# Patient Record
Sex: Male | Born: 1945 | Race: White | Hispanic: No | Marital: Married | Smoking: Former smoker
Health system: Southern US, Community
[De-identification: ages and names within clinical notes are randomized; demographics above are authoritative.]

## PROBLEM LIST (undated history)

## (undated) DIAGNOSIS — E785 Hyperlipidemia, unspecified: Secondary | ICD-10-CM

## (undated) DIAGNOSIS — I10 Essential (primary) hypertension: Secondary | ICD-10-CM

## (undated) DIAGNOSIS — I472 Ventricular tachycardia: Secondary | ICD-10-CM

## (undated) DIAGNOSIS — I2510 Atherosclerotic heart disease of native coronary artery without angina pectoris: Secondary | ICD-10-CM

## (undated) DIAGNOSIS — G4730 Sleep apnea, unspecified: Secondary | ICD-10-CM

## (undated) DIAGNOSIS — Z9861 Coronary angioplasty status: Secondary | ICD-10-CM

## (undated) DIAGNOSIS — E6609 Other obesity due to excess calories: Secondary | ICD-10-CM

## (undated) DIAGNOSIS — I493 Ventricular premature depolarization: Secondary | ICD-10-CM

## (undated) DIAGNOSIS — Z951 Presence of aortocoronary bypass graft: Secondary | ICD-10-CM

## (undated) DIAGNOSIS — I499 Cardiac arrhythmia, unspecified: Secondary | ICD-10-CM

## (undated) DIAGNOSIS — I25119 Atherosclerotic heart disease of native coronary artery with unspecified angina pectoris: Secondary | ICD-10-CM

## (undated) DIAGNOSIS — IMO0001 Reserved for inherently not codable concepts without codable children: Secondary | ICD-10-CM

## (undated) DIAGNOSIS — I517 Cardiomegaly: Secondary | ICD-10-CM

## (undated) DIAGNOSIS — R011 Cardiac murmur, unspecified: Secondary | ICD-10-CM

## (undated) HISTORY — DX: Other obesity due to excess calories: E66.09

## (undated) HISTORY — DX: Atherosclerotic heart disease of native coronary artery without angina pectoris: I25.10

## (undated) HISTORY — DX: Cardiac murmur, unspecified: R01.1

## (undated) HISTORY — DX: Hyperlipidemia, unspecified: E78.5

## (undated) HISTORY — DX: Essential (primary) hypertension: I10

## (undated) HISTORY — PX: TOTAL HIP ARTHROPLASTY: SHX124

## (undated) HISTORY — PX: NM MYOVIEW LTD: HXRAD82

## (undated) HISTORY — PX: EYE SURGERY: SHX253

## (undated) HISTORY — DX: Reserved for inherently not codable concepts without codable children: IMO0001

## (undated) HISTORY — DX: Cardiomegaly: I51.7

## (undated) HISTORY — PX: TONSILLECTOMY: SUR1361

---
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
## 1989-07-22 HISTORY — PX: NECK SURGERY: SHX720

## 1997-07-22 HISTORY — PX: CORONARY ARTERY BYPASS GRAFT: SHX141

## 2006-10-13 ENCOUNTER — Ambulatory Visit (HOSPITAL_BASED_OUTPATIENT_CLINIC_OR_DEPARTMENT_OTHER): Admission: RE | Admit: 2006-10-13 | Discharge: 2006-10-13 | Payer: Self-pay | Admitting: Orthopedic Surgery

## 2007-02-03 ENCOUNTER — Inpatient Hospital Stay (HOSPITAL_COMMUNITY): Admission: RE | Admit: 2007-02-03 | Discharge: 2007-02-06 | Payer: Self-pay | Admitting: Cardiovascular Disease

## 2007-12-16 ENCOUNTER — Inpatient Hospital Stay (HOSPITAL_COMMUNITY): Admission: AD | Admit: 2007-12-16 | Discharge: 2007-12-18 | Payer: Self-pay | Admitting: Cardiovascular Disease

## 2008-10-24 ENCOUNTER — Inpatient Hospital Stay (HOSPITAL_COMMUNITY): Admission: AD | Admit: 2008-10-24 | Discharge: 2008-10-26 | Payer: Self-pay | Admitting: Cardiovascular Disease

## 2010-10-31 LAB — COMPREHENSIVE METABOLIC PANEL
ALT: 21 U/L (ref 0–53)
AST: 25 U/L (ref 0–37)
Alkaline Phosphatase: 44 U/L (ref 39–117)
CO2: 23 mEq/L (ref 19–32)
Calcium: 9.2 mg/dL (ref 8.4–10.5)
GFR calc Af Amer: >60 mL/min (ref >60)
GFR calc non Af Amer: >60 mL/min (ref >60)
Potassium: 4.1 mEq/L (ref 3.5–5.1)
Sodium: 136 mEq/L (ref 135–145)

## 2010-10-31 LAB — CARDIAC PANEL(CRET KIN+CKTOT+MB+TROPI)
CK, MB: 1.2 ng/mL (ref 0.3–4.0)
CK, MB: 1.7 ng/mL (ref 0.3–4.0)
Relative Index: INVALID (ref 0.0–2.5)
Relative Index: INVALID (ref 0.0–2.5)
Relative Index: INVALID (ref 0.0–2.5)
Total CK: 44 U/L (ref 7–232)
Total CK: 67 U/L (ref 7–232)
Troponin I: <0.01 ng/mL (ref 0.00–0.06)
Troponin I: <0.01 ng/mL (ref 0.00–0.06)

## 2010-10-31 LAB — CBC
HCT: 42.7 % (ref 39.0–52.0)
HCT: 43.7 % (ref 39.0–52.0)
MCHC: 34.9 g/dL (ref 30.0–36.0)
MCV: 94.7 fL (ref 78.0–100.0)
Platelets: 103 10*3/uL — ABNORMAL LOW (ref 150–400)
Platelets: 114 10*3/uL — ABNORMAL LOW (ref 150–400)
RBC: 4.8 MIL/uL (ref 4.22–5.81)
RDW: 12.9 % (ref 11.5–15.5)
WBC: 5.2 10*3/uL (ref 4.0–10.5)
WBC: 6.2 10*3/uL (ref 4.0–10.5)

## 2010-10-31 LAB — URINALYSIS, ROUTINE W REFLEX MICROSCOPIC
Bilirubin Urine: NEGATIVE
Glucose, UA: NEGATIVE mg/dL
Ketones, ur: NEGATIVE mg/dL
pH: 6.5 (ref 5.0–8.0)

## 2010-10-31 LAB — DIFFERENTIAL
Eosinophils Absolute: 0.1 10*3/uL (ref 0.0–0.7)
Eosinophils Relative: 1 % (ref 0–5)
Lymphs Abs: 1.5 10*3/uL (ref 0.7–4.0)
Monocytes Relative: 9 % (ref 3–12)

## 2010-10-31 LAB — HEPARIN LEVEL (UNFRACTIONATED): Heparin Unfractionated: 0.72 IU/mL — ABNORMAL HIGH (ref 0.30–0.70)

## 2010-10-31 LAB — PROTIME-INR: INR: 1 (ref 0.00–1.49)

## 2010-10-31 LAB — LIPID PANEL
HDL: 35 mg/dL — ABNORMAL LOW (ref >39)
Triglycerides: 46 mg/dL (ref <150)
VLDL: 9 mg/dL (ref 0–40)

## 2010-10-31 LAB — BASIC METABOLIC PANEL
BUN: 11 mg/dL (ref 6–23)
BUN: 7 mg/dL (ref 6–23)
Calcium: 8.8 mg/dL (ref 8.4–10.5)
Chloride: 108 mEq/L (ref 96–112)
Creatinine, Ser: 0.95 mg/dL (ref 0.4–1.5)
GFR calc non Af Amer: >60 mL/min (ref >60)
Glucose, Bld: 91 mg/dL (ref 70–99)
Potassium: 3.6 mEq/L (ref 3.5–5.1)
Potassium: 3.7 mEq/L (ref 3.5–5.1)

## 2010-10-31 LAB — APTT
aPTT: 186 seconds — ABNORMAL HIGH (ref 24–37)
aPTT: 27 seconds (ref 24–37)

## 2010-10-31 LAB — HEMOGLOBIN A1C: Hgb A1c MFr Bld: 4.8 % (ref 4.6–6.1)

## 2010-12-04 NOTE — Cardiovascular Report (Signed)
NAME:  Dean Wilkins, Dean Wilkins NO.:  [id number]   MEDICAL RECORD NO.:  [id number]          PATIENT TYPE:  INP   LOCATION:  2908                         FACILITY:  [facility]   PHYSICIAN:  Richard A. Alanda Amass, M.D.DATE OF BIRTH:  [birth year]   DATE OF PROCEDURE:  12/17/2007  DATE OF DISCHARGE:                            CARDIAC CATHETERIZATION   PROCEDURES:  Retrograde central aortic catheterization, selective  coronary angiography via Judkins technique, left ventricular angiogram,  right anterior oblique and left anterior oblique projection, saphenous  vein graft angiography, subselective left internal mammary artery,  aortic root angiogram, left anterior oblique projection, abdominal  aortic angiogram, and midstream pulmonary artery projection.   PROCEDURE IN DETAIL:  The patient was brought to the second floor of CP  lab in a postabsorptive state after premedication of 5 mg Valium p.o.  The right groin was prepped and draped in the usual manner.  A 1%  Xylocaine was used for local anesthesia and CRFA was entered with a  single arterial puncture using an 18-gauge thin wall needle.  A 6-French  sidearm sheath was inserted without difficulty.  Diagnostic coronary  angiography was done with 6-French 4-cm taper Cordis preformed coronary  catheters.  Subselective LIMA and left subclavian injection was done  with the right coronary catheter.  Previous angiography had demonstrated  occlusion of these 2 aortic anastomoses; one was an SVG to the OM and  one was an atretic RIMA to the distal RCA visualized retrograde.  Aortic  root angiogram was done at 25 mL, 20 mL per second in the LAO  projection.  An ejection about the level of the renal arteries with the  pigtail catheter was done in the abdominal aorta demonstrating 2 widely  patent single renal arteries on the left and one single renal artery on  the right.   The catheter was removed.  The sidearm sheath was flushed.   Pending  review of the cineangiogram.  The patient tolerated the diagnostic  procedure well.   PRESSURES:  LV:  115/0; LVEDP 18-20 mmHg.  CA:  115/70 mmHg.   There is no gradient across the aortic valve on catheter pullback.   Fluoroscopy showed 2+ calcification of the right and 2+ calcification of  the proximal LAD.  No significant intracardiac or valvular calcification  seen.   LV angiogram demonstrated hypokinesis in the mid anterolateral wall with  a moderate degree in the RAO projection, only minimal LAO projection,  and minimal inferior wall hypokinesis.  Overall ejection fraction was  approximately 50%-55%.  There was no mitral regurgitation, angiographic  LVH present.   Aortic root angiogram did not demonstrate any AI, and there were no  visualization of any antegrade grafts from the graft markers.   The main left coronary was small probably mildly diseased about 30%  smooth with no significant focal stenosis and good flow.  The LAD was  occluded in its proximal third with no antegrade flow.   The circumflex artery had 30% narrowing proximally.  Then it gave off a  large first marginal branch from the junction of proximal third that had  30%-40% narrowing with good residual lumen.  The main branch bifurcated  distally and had no significant stenosis.  A superior bifurcation branch  had a 95%-99% focal stenosis and 90% segmental stenosis with the band  and small bifurcating distal branch but still had antegrade flow and was  unchanged from prior angiograms.  The AV groove circ terminated in 2  bifurcating branches that were large with no significant stenosis, and  there was a moderate-sized CABG branch that was normal.   The right coronary was a dominant vessel.  There was 30% proximal  narrowing with good residual lumen, 40% narrowing slightly hypodense  beyond the RA and conus branch.  There were 2 tandem mid RCA stents in  place.  The first was a non-DES that had been  placed in 2000 and was an  AVE 3.5 x 16 that had no significant restenosis.  The second was  overlapping the DES stent placed by Dr. Allyson Sabal for progression of disease  on February 03, 2007, 3.0 x 23 Cypher postdilated to 3.5 and had no  significant stenosis.  Beyond this; however, there was eccentric 75% or  greater stenosis mildly focal beyond the stented area and associated 50%  irregularity and narrowing beyond that.  The remainder of the right  coronary had a very large PDA and PLA branch that bifurcated.  Had no  significant stenosis with the  AV node branch arising from the PLA.   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  He is a brother of one of our  employees.  Quit smoking about 40 years ago.  He had CABG in [facility]  in 1999 and prior angiography has showed patent LIMA to LAD with  occluded vein graft to circumflex and an atretic RIMA to the distal  right.  In 2000, he had non-DES stent to the mid right and in 2007/08 he  had a DES stent placed tandem to this as outlined above.  He has done  well up until the last several months when he has developed recurrent  angina over the last 1-2 weeks.  He has had typical substernal chest  pain at rest and with mild exertion and worse over 2 days prior to  admission where he had multiple episodes relieved by nitroglycerin.  He  was admitted to the hospital yesterday from the office.  He had negative  enzymes.  No acute EKG changes.  He has been pain-free on IV  nitroglycerin and heparin.  He has been on chronic aspirin and Plavix  therapy as well as beta blockers, Ranexa, ACE, fish oil, and nitrates.   Catheterization shows widely patent LIMA to LAD with good filling to the  apex which was unchanged and although that is a thin LAD, there was no  focal stenosis and good flow.  He has an atretic RIMA to the distal RCA  which was unchanged and a new lesion beyond his  previously placed mid  RCA stents probably accounting for his clinical ischemia which is very  typical and new for him.  He has an old lesion of the superior branch of  the large first marginal that was unable to be crossed in the past by  Dr. Allyson Sabal.  Does have some antegrade filling, but is unchanged and feeds  a small distal vessel.  So, I would recommend continued medical therapy  for this.  The patient appears to be  a candidate for intervention.  I  have discussed the case with Dr. Elsie Lincoln, and he will consider proceeding  with intervention and stenting of his mid RCA.   CATHETERIZATION DIAGNOSES:  1. Atherosclerotic heart disease, status post remote coronary artery      bypass graft x3 1999.  2. Three or four grafts occluded August 11, 2007, with patent left      internal mammary artery to left anterior descending artery.  3. Non-drug eluting stent AVE stent, mid right coronary artery 2000.  4. Tandem mid right coronary artery stent, drug-eluting stent Cypher,      July 2008.  5. Hyperlipidemia.  6. Hypertension.  7. Patent renal arteries.  8. 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  9. Unstable angina, accelerated related to new right coronary lesion.      Plan intervention as outlined above.  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  11.Gastroesophageal reflux disease.  12.Hyperlipidemia on therapy.      Richard A. Alanda Amass, M.D.  Electronically Signed     RAW/MEDQ  D:  12/17/2007  T:  12/18/2007  Job:  [id number]   cc:   [facility]  Madaline Savage, M.D.

## 2010-12-04 NOTE — Cardiovascular Report (Signed)
NAME:  Dean Wilkins, Dean Wilkins NO.:  [id number]   MEDICAL RECORD NO.:  [id number]          PATIENT TYPE:  INP   LOCATION:  2908                         FACILITY:  [facility]   PHYSICIAN:  Madaline Savage, M.D.DATE OF BIRTH:  [birth year]   DATE OF PROCEDURE:  DATE OF DISCHARGE:                            CARDIAC CATHETERIZATION   INDICATIONS FOR PROCEDURE:  [name] is a 65 year old gentleman who  has been a long-time patient of Dr. Susa Griffins who entered [facility] on or around Dec 16, 2007, because of chest pain and  pressure and dyspnea.  He has a known coronary artery disease and had  previous coronary bypass grafting x4 in 1999.  He also has hypertension,  hyperlipidemia, and mild left ventricular dysfunction with ejection  fraction of 45%.   Dr. Alanda Amass performed a cardiac catheter on the patient today on Dec 17, 2006 and identified that his culprit vessel as being his RCA where  he had a non drug-eluting stent placed in the midportion of the vessel  followed by a Cypher 3.5 stent just distal to the first stent, and there  was an unprotected area of 75% eccentric stenosis in the mid RCA just  before the acute angle of the heart.   It was at that point that I became involved with the case and was glad  to perform percutaneous coronary stenting in a direct fashion  overlapping a new vision 3.5 x 20 mm stent, which overlapped with the  second stent already placed in the mid RCA.  I overlapped the new stent  with the other 2 and post dilated the vision stent with a DuraStar  balloon 4.0 x 15 and took the inflation pressure to 13 atmospheres.  There was a smooth seamless transition zone between the 2 indwelling  stents and the new vision stent, and there was preservation of TIMI 3  flow distally.  I viewed the vessel in 2 projections and found there to  be no evidence of any problems and as previously stated there was TIMI 3  distal flow.   IMPRESSION:  New mid right coronary artery stent, now 3 overlapped  stents with no residual stenosis in the mid right coronary artery.           ______________________________  Madaline Savage, M.D.     WHG/MEDQ  D:  12/17/2007  T:  12/18/2007  Job:  [id number]

## 2010-12-04 NOTE — Cardiovascular Report (Signed)
NAME:  Dean Wilkins, BUCKLES NO.:  [id number]   MEDICAL RECORD NO.:  [id number]          PATIENT TYPE:  INP   LOCATION:  2807                         FACILITY:  [facility]   PHYSICIAN:  Richard A. Alanda Amass, M.D.DATE OF BIRTH:  09/03/1945   DATE OF PROCEDURE:  10/25/2008  DATE OF DISCHARGE:                            CARDIAC CATHETERIZATION   PROCEDURES:  Retrograde central aortic catheterization, selective  coronary angiography via Judkins technique, saphenous vein graft  angiography, selective left internal mammary artery, left ventricular  angiogram RAO/ LAO projection, abdominal aortic angiogram, hand  injection.   PROCEDURE:  The patient is brought to the second floor CP Lab in a  postabsorptive state after premedication with 5 mg Valium p.o.  Informed  consent was obtained to proceed with diagnostic angiography.  Baseline  laboratory was unremarkable with normal BUN, creatinine, and myocardial  infarction was ruled out by serial enzymes and EKGs.  Right groin was  prepped, draped in usual manner.  Xylocaine 1% was used for local  anesthesia.  The patient was given 2 mg of Versed for sedation at the  beginning of the procedure.  The CRFA was entered with single anterior  puncture using 18 thin-wall needle and a 6-French short arterial side-  arm sheath was inserted without difficulty.  Coronary angiography was  done using guidewire exchange and with 6-French 4-cm taper preformed  Cordis coronary catheters.  Saphenous vein graft angiography was done  with the right coronary catheter and selective LIMA was done with a  right coronary catheter.  LV angiogram was done in the RAO and LAO  projection, 25 mL, 14 mL per second, 20 mL 12 mL per second  respectively.  Pullback pressure CA showed no gradient across the aortic  valve.  Catheter was pulled out above the level of renal arteries and  abdominal angiogram was done by hand injection and the midstream PA  projection.   It demonstrated single normal renal arteries bilaterally.  Catheter was removed.  Side-arm sheath was flushed.  The patient  tolerated the diagnostic procedure well.   PRESSURES:  LV:  105/0; LVEDP 18 mmHg.   CA:  105/65 mmHg.   There is no grade across the aortic valve on catheter pullback.   Fluoroscopy:  Fluoroscopy showed the previously placed triple tandem  segmental mid LAD stents in place.  There was 1-2 plus left coronary and  proximal right coronary calcification.   The main left coronary artery was normal.   The LA with minor irregularities and about 20% narrowing with good  residual lumen in its distal portion.  The LAD was 100% occluded at its  proximal portion with no antegrade filling.   The circumflex artery gave off a bifurcating first marginal branch from  the junction of the proximal and mid-third that showed a 99% stenosis  that was old and daylight appearing, it was tandem 199% stenosis of a  moderate size bifurcating marginal that was unchanged.  Beyond this,  there was a second marginal that bifurcated and it was large, had no  significant stenosis in the PABG branch that bifurcated and had  no  significant stenosis.  There is 40% narrowing between OM-1 and OM-2 that  was unchanged and there was good flow to the circumflex proper.   The right coronary was a dominant vessel.  There was conus branch  proximally followed by a 40% narrowing at the junction of the proximal  third.  There was a hazy 70% stenosis at the origin of an RV and an  atrial branch to junction of the proximal third that was angiographic  appearance of disrupted plaque.  There were then triple tandem stents  throughout the mid right coronary artery from prior procedures in 2000,  708 and 509.  There was minor in-stent restenosis of 30% and proximal  stent 30-40% in the mid stent fairly focally and 30% in the proximal  portion of the third stent.  There was good residual lumen and flow.   There was a large PDA and bifurcating PLA that were widely patent with  good flow.   Previously placed saphenous vein grafts x2 were occluded and the free  RMA graft to the RCA was occluded and this was unchanged from prior  angiograms.   The LIMA was widely patent, anastomosed to the mid LAD, filled the LAD  down to the apex, although this was a relatively thin vessel and had  good flow with no significant stenosis.  There was retrograde filling of  one small and one moderate size diagonal branch through the LIMA.  The  subclavian was tortuous, but did not have any high-grade stenosis.  The  vertebral was antegrade.   LV angiogram demonstrated hypodense akinesis in mid anterolateral wall  and hypodense akinesis of the inferoapical wall and posteroapical wall.  This was unchanged.  EF was greater than 45%.  There was no mitral  regurgitation present.   DISCUSSION:  Mr. Capell is a long-term patient of mine who is married  with 2 children and 2 grandchildren.  Nonsmoker, but chews tobacco.  He  is self-employed as a Nutritional therapist and still works full-time.  He has had  remote CABG in [facility] 19x3 in [place].  Because of unstable  angina, he underwent non-DES AVE stenting of the mid RCA native vessel  with occluded RMA to the RCA and occluded SVG to the circumflex, patent  LIMA to the LAD in January 2000.   He had progression of disease in the mid RCA and had a DES Cypher stent  placed in July 2008.  He subsequently had symptomatic progression of  disease again and in May 2009 had a large Liberte 3.5 stent placed to  the mid RCA in a tandem fashion just beyond the other stents postdilated  to 40.  He has also had a 99% stenosis of the OM-1, which is old and  attempts to cross that in the past by Dr. Allyson Sabal were unsuccessful, and  this has been abandoned and angiographically this is unchanged.   He presents with unstable angina, now with typical recurrent chest pain  without  myocardial infarction similar to what he had last year.  It  looks like his culprit lesion is the hazy 70% proximal RCA stenosis.   He could also be getting some angina from his 99% OM, but this is not  revascularizable and it was failed PCI with attempts in the past.  I  reviewed the films with Dr. Tresa Endo and we recommend PCI with intervention  proximal to its previously placed stents in the dominant right coronary  for symptom relief.  CATHETERIZATION DIAGNOSES:  1. Unstable angina without myocardial infarction.  2. Progression of disease with hazy 70% culprit lesion proximal      right coronary artery proximal to triple tandem mid right coronary      artery previously placed stents.  3. Coronary artery bypass graft in 1999 at [facility].  4. AVE non-drug-eluting stent mid right coronary artery January 2000      widely patent long-term.  5. Cypher 3.5 mm dilated to 40 stent in July 2008 tandem mid right      coronary artery.  6. Liberte non-drug-eluting stent 3.5 dilated to 4 tandem non-drug-      eluting stent in May 2009, mid right coronary artery.  7. Patent left internal mammary artery to left anterior descending      long-term.  8. Occluded saphenous vein graft to circumflex, occluded right      internal mammary artery to right coronary artery, chronic since      2000 catheterization.  9. Left ventricular dysfunction.  No congestive heart failure.      Ejection fraction of greater than 45% wall motion abnormalities as      outlined.  10.Single normal renal arteries.  11.Hyperlipidemia under therapy, tolerating Zocor long-term with good      numbers.  12.Exogenous obesity.  13.Gastroesophageal reflux disease.      Richard A. Alanda Amass, M.D.  Electronically Signed     RAW/MEDQ  D:  10/25/2008  T:  10/25/2008  Job:  [id number]   cc:   Nicki Guadalajara, M.D.  [facility]

## 2010-12-04 NOTE — Discharge Summary (Signed)
NAME:  Dean Wilkins, FEUTZ NO.:  [id number]   MEDICAL RECORD NO.:  [id number]          PATIENT TYPE:  INP   LOCATION:  2505                         FACILITY:  [facility]   PHYSICIAN:  Richard A. Alanda Amass, M.D.DATE OF BIRTH:  1946/01/09   DATE OF ADMISSION:  10/24/2008  DATE OF DISCHARGE:  10/26/2008                               DISCHARGE SUMMARY   DISCHARGE DIAGNOSES:  1. Unstable angina, negative myocardial infarction.  2. Coronary artery disease with new hazy 70% stenosis of the right      coronary artery proximal to previous stents undergoing percutaneous      transluminal coronary angioplasty and stent deployment with a drug-      eluting Xience stent.  3. Mild left ventricular dysfunction with ejection fraction 45%.  4. History of coronary artery disease with bypass grafting in 1999.  5. Hypertension, controlled.  6. Hyperlipidemia.  7. Tobacco abuse with chewing tobacco only and consult for cessation      completed.  8. Gastroesophageal reflux disease.   DISCHARGE CONDITION:  Improved.   PROCEDURES:  Combined left heart cath October 25, 2008, and graft  visualization by Dr. Susa Griffins.   October 25, 2008, percutaneous transluminal coronary angioplasty and stent  deployment to the proximal right coronary artery above previously placed  stents by Dr. Nicki Guadalajara.   DISCHARGE MEDICATIONS:  1. Toprol-XL 25 mg daily.  2. Plavix 75 mg daily.  3. Zocor 20 mg daily.  4. Folbic 2.5/25 one daily.  5. Dyazide 37.5/25 Monday, Wednesday, Friday.  6. Fish oil 2 g twice a day.  7. Isosorbide 30 mg daily.  8. Quinapril 20 mg twice a day.  9. Aspirin 325 mg for 1 month then 2 of 81 mg daily.  10.Nitroglycerin sublingual as needed for chest pain.  11.Do not stop Plavix, stopping could cause a heart attack.   DISCHARGE INSTRUCTIONS:  1. Return to work Monday, October 31, 2008.  2. Low-sodium heart-healthy diet.  3. Wash cath site with soap and water.  Call if any  bleeding,      swelling, or drainage.  4. Increase activity slowly, shower.  No lifting for 2 days.  No      driving for 2 days.  5. Follow up with Dr. Alanda Amass November 01, 2008, at 10:30 a.m.  6. Stop using tobacco.   HISTORY OF PRESENT ILLNESS:  A 65 year old white married male patient of  Dr. Alanda Amass called the office on October 24, 2008, with complaints of  increasing angina.  He had had episodes since the week previous.  Initially, he was cutting up wood, he was dizzy, had to stop and hold  onto was tree then he had chest pain.  Nitroglycerin helped somewhat but  the pain lasted all day that day, none on Wednesday and Thursday of last  week but then on Friday it did return.  Yesterday, he just walked 400  feet and developed chest pain.  On the day of admission October 24, 2008,  he was sitting in a chair and the pain came on at 6/10 and took a total  of  3 nitro sublingually and it was down to 3/10.  He did have shortness  of breath but no nausea, vomiting, or diaphoresis.  The patient was  instructed to go to the emergency room but he preferred to be more of a  direct admit, so we did go ahead and see him in the office, evaluate  him, and admitted him to [facility] for cardiac catheterization.   PAST CARDIAC HISTORY:  Bypass in 1999.  He does have an occluded LIMA to  the RCA.  He has a patent LIMA to the LAD, occluded vein graft to OM1,  occluded vein graft to OM, and a non drug-eluting stent to the mid RCA,  then a DES Cypher stent was placed in the mid RCA beyond the previously  placed stent and in 2009 a non-drug-eluting stent to the mid RCA was  placed.  He has done very well until recently.  He also complained of  some heart racing on Saturday when he lay down, but no further  complaints of that.   Other history includes hypertension, hyperlipidemia, tobacco abuse by  chewing tobacco, reflux disease.   ALLERGIES:  NIASPAN and LIPITOR.   FAMILY HISTORY, SOCIAL HISTORY, AND REVIEW  OF SYSTEMS:  See H and P.   PHYSICAL EXAMINATION AT DISCHARGE:  VITAL SIGNS:  Blood pressure 98/69,  pulse 62 regular, temperature 98.1.  GENERAL:  Doing well, alert.  HEART:  S1 and S2.  Regular rate and rhythm without murmur or gallop.  LUNGS:  Clear.  ABDOMEN:  Groin cath site without hematoma.  EXTREMITIES:  The patient ambulated with cardiac rehab without problems.   On admission, hemoglobin 16, hematocrit 46, WBC 6.2, platelets 136, and  neutrophils 64.  Prior to discharge, hemoglobin 15, hematocrit 43, WBC  5.2, platelets 103.  Chemistry on admission, sodium 136, potassium 4.1,  chloride 104, CO2 23, BUN 15, creatinine 0.98, glucose 84 that remained  stable.  On admission, protime 13.5, INR 1, PTT 27.  On heparin, he was  therapeutic, AST 25, ALT 21, alkaline phos 44, total bili 0.8, and  albumin 4.   CK-MBs and troponin are all negative.  CKs ranged 59-44, MBs negative.  Troponin less than 0.01.  Total cholesterol 114, LDL 70, HDL 35,  triglycerides 46, calcium 8.8, magnesium 2.4.  UA clear.  TSH 0.878 and  glycohemoglobin was 4.8.   Chest x-ray, no infiltrate or congestive heart failure.   HOSPITAL COURSE:  The patient was admitted was at unstable angina,  crescendo angina, placed on IV heparin and nitroglycerin.  He stabilized  with no further pain.  Next morning, he underwent cardiac  catheterization with the results as previously described.  By October 26, 2008, the patient was ambulating without problems, felt stable, and will  follow up with Dr. Alanda Amass as an outpatient.  He was seen by Dr. Jacinto Halim  and discharged.      Darcella Gasman. [place], N.P.      Richard A. Alanda Amass, M.D.  Electronically Signed    LRI/MEDQ  D:  10/26/2008  T:  10/27/2008  Job:  [id number]

## 2010-12-04 NOTE — Discharge Summary (Signed)
NAME:  Dean Wilkins, Dean Wilkins              ACCOUNT NO.:  [id number]   MEDICAL RECORD NO.:  [id number]          PATIENT TYPE:  INP   LOCATION:  3733                         FACILITY:  [facility]   PHYSICIAN:  Richard A. Alanda Amass, M.D.DATE OF BIRTH:  02-May-1946   DATE OF ADMISSION:  02/03/2007  DATE OF DISCHARGE:  02/06/2007                               DISCHARGE SUMMARY   DISCHARGE DIAGNOSES:  1. Unstable angina, status post right coronary artery Cypher stenting      this admission.  2. Residual coronary disease.  This is to be treated medically after      unsuccessful attempt at intervention this admission.  3. Coronary artery bypass grafting in 1999.  4. Treated hypertension.  5. Treated dyslipidemia.  6. Degenerative joint disease right shoulder, status post surgery in      March 2008.   HOSPITAL COURSE:  The patient is a 65 year old male followed by Dr.  Alanda Amass at [facility] with a history of coronary artery  bypass grafting in 1999 [place].  Catheterization subsequently showed  occlusion of the RIMA to the RCA and the patient had a native RCA stent  in January 2000.  His last catheterization was January 2004.  At that  time he had a 50% in-stent restenosis to the RCA with an occlusion of  the RIMA the RCA which was old and a patent LIMA to the LAD.  The SVG to  OM-1 and OM-2 was occluded.  This was old.  The patient has been treated  medically.  He had a Myoview January 2008 which was positive for  anterolateral ischemia which we suspected was secondary to OM disease.  He did well on medical therapy until this past Thursday when he noted  substernal chest pain which improved with nitroglycerin.  Symptoms have  recurred.  He called the office on February 02, 2007.  He declined to be  hospitalized that day but agreed to come in the next morning for same-  day cath.  We increased his nitrate.  He was admitted the morning of the  15th for catheterization which was done by Dr.  Allyson Sabal.  This revealed  normal LV function, normal renal arteries, normal iliacs, and total  native LAD, 70% RCA after the previously placed stent, and an 80%  circumflex and 95% OM.  The LIMA to LAD was patent.  The patient  underwent intervention to the RCA with a Cypher stent.  Dr. Allyson Sabal was  unable to intervene on the circumflex and OM because of possible  dissection.  The patient was treated medically.  Initially we had  planned to take another look in 48 hours with another attempt at  intervention, but since he did so well with medical therapy, we decided  to continue this.  We feel he could be discharged February 06, 2007.  He  will follow up with Dr. Alanda Amass as previously scheduled.  He knows to  limit his activity to nothing strenuous until he sees Dr. Alanda Amass in  the office.   DISCHARGE MEDICATIONS:  1. Toprol XL 25 mg a day.  2. Plavix  75 mg a day.  3. Zocor 20 mg a day.  4. Imdur will be increased to 60 mg a day.  5. Quinapril 20 mg a day.  6. Aspirin 81 mg a day.  7. Dyazide every other day.  8. Nitroglycerin p.r.n.  9. Fish oil 1 gram a day.  10.Folic acid.   LABORATORY DATA:  White count 5.7, hemoglobin 14.3, hematocrit 41,  platelets 114.  Sodium 136, potassium 3.6, BUN 10, creatinine 0.98.  Liver functions were normal.  Troponins peaked at 0.5.  CK-MBs were  negative.  Cholesterol of 130 with an LDL was 72, HDL 34.  TSH is 1.10.  Chest x-ray showed no acute disease.  Inferior sternotomy wire is  parted.  Urinalysis was unremarkable.  INR 1.1.  EKG shows sinus rhythm  without acute changes.   DISPOSITION:  The patient is discharged in stable condition and will  keep his appointment with Dr. Alanda Amass on August 1.      Abelino Derrick, P.A.      Richard A. Alanda Amass, M.D.  Electronically Signed    LKK/MEDQ  D:  02/06/2007  T:  02/07/2007  Job:  [id number]

## 2010-12-04 NOTE — Cardiovascular Report (Signed)
NAME:  Dean Wilkins, Dean Wilkins NO.:  [id number]   MEDICAL RECORD NO.:  [id number]          PATIENT TYPE:  OIB   LOCATION:  2908                         FACILITY:  [facility]   PHYSICIAN:  Nanetta Batty, M.D.   DATE OF BIRTH:  [birth year]   DATE OF PROCEDURE:  DATE OF DISCHARGE:                            CARDIAC CATHETERIZATION   Dean Wilkins is a 65 year old white male with onset of CABG in 1999 at  [facility].  He has known graft occlusion with stenting of his RCA in  the past.  His other problems include hypertension, dyslipidemia.  He  called the office yesterday complaining of unstable angina over the last  several days.  He presents now for diagnostic coronary arteriography to  define his anatomy and rule out ischemic etiology.   DESCRIPTION OF PROCEDURE:  The patient is brought to the second floor  [facility] in postabsorptive state.  He premedicated  with p.o. Valium, IV Versed.  His right groin was prepped and draped in  usual sterile fashion, and 1% Xylocaine was used for local anesthesia.  A 6-French sheath was inserted into the right femoral artery using  standard Seldinger technique.  A 6-French, left Judkins diagnostic  catheter as well as French pigtail catheter and IMA catheters were used  for selective coronary angiography, selective LIMA angiography, left  ventriculography and distal abdominal aortography.  Visipaque dye was  used through the entirety of the case.  Aortic ventricular blood  pressures were recorded.   HEMODYNAMIC RESULTS:  Aortic systolic pressure 121, diastolic pressure  75, excellent ventricular systolic pressure 123, end-diastolic pressure  15.   ELECTIVE CHOLANGIOGRAPHY:  1. Left main normal.  2. LAD occluded at its origin.  3. Left Circumflex; large vessel with 70-80% long stenosis on multiple      bends involving OM1.  There was a superior branch which was      moderate in size.  It had 95% ostial stenosis.  4.  Right coronary; large dominant vessel with long 75-80% stenosis,      patent stent in the midportion.  5. Left ventriculography; RAO left ventriculogram was performed using      25 mL of Visipaque dye at 12 mL per second.  The overall LVEF was      estimated between 50-55% with mild global hypokinesia.  6. Left internal mammary artery; this vessel was selectively      visualized and was widely patent down to the distal anastomosis.      The vein grafts were occluded.  7. Distal abdominal aortography; distal abdominal aortogram was      performed using 20 mL of Visipaque dye at 20 mL per second.  The      renal arteries were widely patent.  Infrarenal abdominal aorta and      iliac bifurcation free of disease.   IMPRESSION:  Dean Wilkins has a high-grade long segmental mid right and  circumflex obtuse marginal disease of the patent LIMA.  We will proceed  with intervention on his right and possibly staged circumflex  intervention using Angiomax.   The  sheath was upgraded to a 7-French sheath.  The patient received 3  chewable aspirin, 300 mg of Plavix.  He was already on Plavix at home,  but had not taken his medications the morning of admission.  He received  Angiomax bolus with ACT of 342.  Visipaque dye was used through the  entirety of the case.  Retrograde aortic pressures were monitored in the  case.  The patient received 200 mcg intracoronary nitroglycerin several  times during the case.   Using a 7-French hockey stick with side-hole guide catheter along with  ON4 190 Asahi soft wire and a 25 12-Maverick, angioplasty was performed  on the mid right coronary artery using overlapping inflations.  Following this, a 3-0 23 Cypher was then deployed at 18 atmospheres and  postdilated with a 35 20-Quantum at 18 atmospheres (3.7 mm resulting in  reduction of a long 70-80% stenosis to 0% residual with excellent flow).   Attention was then focused on the circumflex.  Using a 7-French  XP4.0  guide catheter along with an ON4 190 Asahi soft wire and 25 12-Maverick,  overlapping inflations was performed.  Attempts were made to place a 25  23-Cypher stent unsuccessfully and post dilatation with a larger balloon  was also unsuccessful.  Buddy wire technique was attempted.  However a  second wire was never able to cross the lesion, ostensibly because of  the dissection.  A 208 Maverick over the wire balloon was then used  after the size soft was cinched.  The balloon was taken down to the  distal marginal and the mailman wire was exchanged.  Upon removal of the  balloon, the mailman wire sprung back using wire position.  Multiple  attempts to regain wire position with multiple wires with the assistance  of Dr. Clarene Duke were unsuccessful.  The patient remained hemodynamically  stable throughout the case.  He denied chest pain.  There were no EKG  changes.  It was elected to abort the procedure and have the patient  remain on Integrilin for another 24-48 hours after which another attempt  will be made to gain access to the distal OM branch and perform  stenting.  The patient left the lab in stable condition.      Nanetta Batty, M.D.  Electronically Signed     JB/MEDQ  D:  02/03/2007  T:  02/04/2007  Job:  [id number]   cc:   [facility]  [facility]  Richard A. Alanda Amass, M.D.  [facility]

## 2010-12-04 NOTE — H&P (Signed)
NAME:  Dean Wilkins, Dean Wilkins NO.:  [id number]   MEDICAL RECORD NO.:  [id number]          PATIENT TYPE:  OIB   LOCATION:  2908                         FACILITY:  [facility]   PHYSICIAN:  Richard A. Alanda Amass, M.D.DATE OF BIRTH:  1946-04-27   DATE OF ADMISSION:  02/03/2007  DATE OF DISCHARGE:                              HISTORY & PHYSICAL   CHIEF COMPLAINTS:  Chest pain.   HISTORY OF PRESENT ILLNESS:  Dean Wilkins is a 65 year old male followed  by Dr. Alanda Amass and followed at the [facility].  He had  bypass surgery in May 1999 in [place].  According his history, within  a month he was restudied and had some occlusion of his grafts.  In  January 2000, he was catheterized and had occlusion of the RIMA to the  RCA, occlusion of the SVG to OM-1 and occlusion of the SVG to the OM-2.  The LIMA to the LAD was patent.  He underwent native RCA stenting.  He  was last studied in 2004.  He had a 50% in-stent restenosis at the RCA  with a patent LIMA to the LAD.  Plan was for medical therapy.   The patient had a Myoview in January 2008 which did show some mild to  moderate anterolateral ischemia, Dr. Alanda Amass felt this may have been  secondary to his OM disease.  He was treated medically and actually did  quite well this year until this past Thursday when he noted some  substernal chest pain at rest.  His symptoms improved with  nitroglycerin.  His symptoms have recurred on a daily basis.  He called  the office Monday.  We encouraged him to come to the hospital, but he  declined.  We increased his Imdur and set him up for admission for same-  day catheterization today, February 03, 2007.  He has not had recurrent  chest pain since we talked to him yesterday.   PAST MEDICAL HISTORY:  Remarkable for:  1. Right shoulder surgery by Dr. Thurston Hole in  March 2008.  He did come      off Plavix for this for a few days.  He is back on Plavix now.  2. He has a history of treated  hypertension.  3. History of treated dyslipidemia; his last LDL was 67 in December.  4. He has had some prior knee surgery.  5. He has had reflux off and on in the past.   CURRENT MEDICATIONS:  1. Toprol XL 25 mg a day.  2. Imdur 30 mg a day. This was just increased to 60 mg yesterday.  3. Plavix 75 mg a day.  4. Zocor 20 mg a day.  5. Aspirin 81 mg a day.  6. Quinapril 40 mg a day.  7. Folic acid.  8. Maxzide 37.5/25 two times a week.  9. Levoxyl 1 gram b.i.d.   ALLERGIES:  He is intolerant to NIASPAN but has no other drug allergies.   SOCIAL HISTORY:  He is a remote smoker.  He is married.  He is two  children and two grandchildren.  He  is active.   FAMILY HISTORY:  His father died at 76 of an MI, and he has a brother in  his 47s who had coronary disease in his 25s.  His mother died in her 56s  of brain cancer.   REVIEW OF SYSTEMS:  Essentially unremarkable except as noted above.  He  denies any GI bleeding or melena.  He has not had recent fever or chills  or illness.  He has not had any trouble with kidney stones or kidney  trouble.  He has no trouble passing his water and has no history of  prostate disease or dysuria.  He has not had diabetes or thyroid  trouble.  He does wear glasses.  He still has some pain in his right  shoulder and is getting physical therapy after surgery.   PHYSICAL EXAMINATION:  VITAL SIGNS:  Blood pressure of 126/86, pulse 76,  respirations 12.  GENERAL:  He is well-developed, well-nourished male in no acute  distress.  HEENT:  Normocephalic, atraumatic.  Extraocular movements intact.  Sclerae nonicteric.  He does wear glasses.  NECK:  Without JVD or bruit.  CHEST:  Clear to auscultation and percussion.  CARDIAC:  Reveals regular rate and rhythm without obvious murmur, rub or  gallop.  Normal S1 and S2.  ABDOMEN:  Nontender.  No hepatosplenomegaly.  No bruits.  EXTREMITIES:  Without edema.  Distal pulses are intact.  The are no  femoral  artery bruits noted.  NEUROLOGIC:  Exam grossly intact.  He is awake, alert and oriented,  cooperative.  Moves all extremities without obvious deficit.  SKIN:  Warm and dry.   Labs and EKG are pending.   IMPRESSION:  1. Unstable angina.  2. Coronary disease, coronary artery bypass grafting in 1999 with 3 of      4 grafts occluded by January 2000, treated with right coronary      artery stenting. Catheterization in 2004 and Myoview in January      2008. Plan is for medical therapy.  3. Treated hypertension.  4. Treated dyslipidemia.  5. Degenerative joint disease with right shoulder surgery March 2008      by Dr. Thurston Hole.   PLAN:  The patient will be admitted for catheterization and further  evaluation.  Will go ahead and hold his Maxzide and add Prilosec.      Abelino Derrick, P.A.      Richard A. Alanda Amass, M.D.  Electronically Signed    LKK/MEDQ  D:  02/03/2007  T:  02/03/2007  Job:  [id number]

## 2010-12-04 NOTE — Discharge Summary (Signed)
NAME:  Dean Wilkins, ROWLAND NO.:  [id number]   MEDICAL RECORD NO.:  [id number]          PATIENT TYPE:  INP   LOCATION:  2908                         FACILITY:  [facility]   PHYSICIAN:  Darcella Gasman. [place], N.P.  DATE OF BIRTH:  [birth year]   DATE OF ADMISSION:  12/16/2007  DATE OF DISCHARGE:  12/18/2007                               DISCHARGE SUMMARY   DISCHARGE DIAGNOSES:  1. Unstable angina.  2. Coronary artery disease with history of bypass grafting.      a.     New stenosis distal to the previous stent in the right       coronary artery undergoing percutaneous transluminal coronary       angioplasty and stent deployment with a Liberte Monorail.      b.     Nonobstructive disease in proximal right coronary artery and       atretic right internal mammary artery.  Left internal mammary       artery was patent and old occluded vein graft to the obtuse       marginal as well as the left internal mammary artery to the  right       coronary artery.      c.     History of non-DES, AVE stent to the mid right coronary       artery.  3. Other history, tobacco chewer, smoking cessation 4 years ago.  4. Systemic hypertension.  5. History of gastroesophageal reflux disease, stable.  6. Hyperlipidemia treated that we increased his Zocor on this visit.  7. Mild left ventricular dysfunction and ejection fraction of 50-55%      on cardiac catheterization.  8. He complains of thrombocytopenia, improved at discharge.   PROCEDURES:  On Dec 17, 2007, combined left heart cath with graft  visualization by Dr. Susa Griffins.   On Dec 17, 2007, PTCA and stent deployment of  Liberte Monorail stent to  the RCA distal to previous stent by Dr. Madaline Savage.   DISCHARGE MEDICATIONS:  1. Zocor, increased to 40 mg nightly.  2. Plavix 75 mg daily.  3. Toprol XL 25 mg daily.  4. Dyazide as before.  5. Lovaza 1 g twice a day.  6. Ranexa 500 mg daily.  7. Extra Strength Tylenol as needed.  8. Imdur 30 mg daily.  9. Quinapril 20 mg twice a day.  10.Aspirin 325 mg daily at least until he sees Dr. Alanda Amass back.  11.Anusol 2.5/25 mg daily.  12.Zantac 150 mg twice a day as needed for reflux.  13.Nitroglycerin 150 mg sublingual as needed for chest pain.   HISTORY OF PRESENT ILLNESS:  Mr. [name] was admitted from the office by  Dr. Alanda Amass because he presented for office visit, having localized  episode in the left parasternal chest discomfort associated with  weakness of his arms and diaphoresis.  On Dec 15, 2007, he had more  episodes lasting 1-2 hours at that time.  He took a total of 6 nitro  with some relief, but the pain lasted a total of 4 hours.  He did sleep  the night before admission and was pain free on the visit, but because  of his known chronic stable angina and now unstable angina and history  of vein graft occlusion, it was felt that he needed to undergo cardiac  catheterization, with crescendo angina.   FAMILY HISTORY:  See H&P.   SOCIAL HISTORY:  See H&P.   REVIEW OF SYSTEMS:  See H&P.   PAST MEDICAL HISTORY:  Same as problem list.   HOSPITAL COURSE:  The patient was admitted by Dr. Alanda Amass from the  office for crescendo unstable angina.  He was started on IV nitro and  heparin, and cardiac cath was set up for Dec 17, 2006, which he did.  His cardiac enzymes have been negative for myocardial infarction.  He  underwent cardiac cath as stated, 75% stenosis of the RCA below the  previous stent, underwent PTCA and stent deployment and then was  admitted back to the CCU.  The patient did well overnight.  Sheath was  pulled without problems and by the next morning, he had no complaints,  seen him today with cardiac rehab without problems and Dr. Elsie Lincoln saw  him and felt he was ready for discharge home.  He will follow up as an  outpatient with Dr. Alanda Amass on December 31, 2007.  Other discharge  instructions do include low-fat and low-salt diet.  No work  until  Wednesday, December 23, 2007.  Increase activity slowly.  May shower.  No  lifting for 2 days.  No driving for 2 days.   PHYSICAL EXAMINATION:  VITAL SIGNS:  At discharge, blood pressure  109/70, pulse 64, respiratory rate stable, sats 94%, and temperature  98.1.  HEART: Regular rate and rhythm without gallop or rub.  LUNGS:  Clear.  ABDOMEN:  Right groin is stable, non swollen, and nontender.  No  hematoma.   LABORATORY DATA:  Hemoglobin 15.9, hematocrit 46.5, WBC 6.5, platelets  on admission 149 with heparin 127, but after heparin D/C'd prior to  discharge, he was 136 for platelets.  Chemistry initially was sodium  136, potassium 4.4, chloride 102, CO2 27, BUN 11, creatinine 1.01, and  glucose 114, this remained stable.  Potassium was 3.5 prior to discharge  and he got 40 of potassium prior to discharge.  Coags on admission,  protime 13.1, INR of 1.0, PTT 28, and heparin was therapeutic.  LFTs,  AST 28, ALT 36, alkaline phos 41, total bili 1.2, and albumin 4.3.  Cardiac enzymes were all negative.  CK 55, 54, 40, and 35, MB 1.5-1.3  and troponin I 0.02-0.04, negative.   Cholesterol 132, LDL 78, HDL 35, and triglycerides 94.  Electrolytes,  calcium 8.8 and BNP on admission less than 30.  PSA was 0.37 and TSH was  1.083.   The patient was discharged home and would follow up as stated.      Darcella Gasman. Annie Paras, N.P.     LRI/MEDQ  D:  12/18/2007  T:  12/19/2007  Job:  [id number]   cc:   [facility]

## 2010-12-04 NOTE — Cardiovascular Report (Signed)
NAME:  Dean Wilkins, Dean Wilkins NO.:  [id number]   MEDICAL RECORD NO.:  [id number]          PATIENT TYPE:  INP   LOCATION:  2505                         FACILITY:  [facility]   PHYSICIAN:  Nicki Guadalajara, M.D.     DATE OF BIRTH:  21-Oct-1945   DATE OF PROCEDURE:  DATE OF DISCHARGE:                            CARDIAC CATHETERIZATION   PROCEDURE:  Percutaneous coronary intervention with primary stenting of  the right coronary artery.   INDICATIONS:  Dean Wilkins is a 65 year old patient of Dr.  Alanda Amass, who is status post prior CABG revascularization surgery and  status post prior multiple stenting with 3 previously placed stents in  his mid right coronary artery.  Please refer to Dr. Kandis Cocking  comprehensive cardiac catheterization report and summarization of prior  studies.  Diagnostic catheterization was done today by Dr. Alanda Amass  which now showed a new 70% area of haziness proximal to the more  proximal stent in the right coronary artery with 40% narrowing proximal  to this.  There was mild 30% narrowing within the previously placed  stents in the RCA.  I was asked to perform a percutaneous coronary  prevention to this native proximal RCA stenosis prior to previously  placed stents.   PROCEDURE:  The patient was already on an Integrilin drip.  He had been  on chronic Plavix.  He received an additional 150 mg of Plavix.  ACT was  176 and he received an additional 3000 units of heparin.  The 6-French  sheath was already in place from his diagnostic catheterization.  A 6-  [place] FR-4 guide with side holes was used as the guiding catheter.  Since primary stenting was to be performed and it would be important to  know exactly length measurements particularly to provide overlap with  the previously placed stent, an ATW marker wire was used for the wire.  It seemed that in order to cover tandem proximal lesions and have  adequate overlap, a 23 mm DES stent would be  necessary.  Consequently, a  3.5 x 23 mm Xience V DES stent was then inserted and successfully  deployed x2 to 13 atmospheres.  A 4.0 x 20 mm noncompliant Voyager  balloon was used for post-stent dilatation.  This balloon was dilated up  to 3.91 mm.  In addition, there was careful attention to make certain  the overlap of the previously placed stent was dilated as well.  Scout  angiography confirmed an excellent angiographic result.  There was brisk  TIMI III flow.  There was no evidence for dissection.  The patient left  the Catheterization Laboratory in stable condition.  He did receive IC  nitroglycerin during the procedure.  In addition, prior to the procedure  for additional sedation, he received 1 additional milligram of Versed  and 50 mcg of fentanyl.   HEMODYNAMIC DATA:  Central aortic pressure was initially 91/62.  At the  completion of the procedure, the pressure was 109/58.   ANGIOGRAPHIC DATA:  Please refer to Dr. Kandis Cocking comprehensive  diagnostic cardiac catheterization report from earlier today.   The right coronary artery  had proximal 40% narrowing followed by a hazy  area of 60-70% narrowing which was proximal to the previously placed  stent.  There were 3 stents that had been previously placed.  The more  proximal was a AVE stent.  The middle one was a 3.5 Cypher stent and the  more distal one was a 3.5 Liberte stent.  The 3.5 x 23 mm Xience V DES  stent was placed to cover the 40 and 70% stenosis and to have adequate  overlap with the previously placed AVE stent.  Following post-stent  dilatation up to 3.91 mm, the entire stented segment was opened.  There  was brisk TIMI III flow.  There was no evidence for dissection.   IMPRESSION:  Successful primary stenting of the proximal right coronary  artery with insertion of a new 3.5 x 23 mm Xience V drug-eluting stent,  postdilated to 3.91 mm done with  Integrilin/heparin/clopidogrel/nitroglycerin.            ______________________________  Nicki Guadalajara, M.D.     TK/MEDQ  D:  10/25/2008  T:  10/25/2008  Job:  [id number]   cc:   Gerlene Burdock A. Alanda Amass, M.D.

## 2010-12-07 NOTE — Op Note (Signed)
NAME:  Dean Wilkins, Dean Wilkins              ACCOUNT NO.:  [id number]   MEDICAL RECORD NO.:  [id number]          PATIENT TYPE:  AMB   LOCATION:  DSC                          FACILITY:  [facility]   PHYSICIAN:  Robert A. Thurston Hole, M.D. DATE OF BIRTH:  06-Aug-1945   DATE OF PROCEDURE:  10/13/2006  DATE OF DISCHARGE:                               OPERATIVE REPORT   PREOPERATIVE DIAGNOSES:  1. Right shoulder partial rotator cuff tear partial labrum tear.  2. 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  3. Right shoulder impingement.  4. 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   POSTOPERATIVE DIAGNOSES:  1. Right shoulder partial rotator cuff tear partial labrum tear.  2. 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  3. Right shoulder impingement.  4. 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   PROCEDURES:  1. Right shoulder EUA followed by arthroscopic debridement partial      rotator cuff tear partial labrum tear.  2. Right shoulder chondroplasty.  3. Right shoulder subacromial decompression.  4. Right shoulder distal clavicle excision.   SURGEON:  Elana Alm. Thurston Hole, M.D.   ASSISTANT:  Julien Girt, P.A.   ANESTHESIA:  General.   OPERATIVE TIME:  45 minutes.   COMPLICATIONS:  None.   INDICATIONS FOR PROCEDURE:  Dean Wilkins is a 65 year old gentleman who  has had 6-8 months of increasing right shoulder pain as well as left  shoulder, right worse than left.  Exam and MRI has revealed partial  rotator cuff tear, partial labrum tear with impingement and  chondromalacia and AC joint spurring.  He has failed conservative care  and is now to undergo arthroscopy.   DESCRIPTION OF PROCEDURE:  Dean Wilkins was brought to the operating room  on October 13, 2006, placed on the operative table in supine position.  After adequate level of general anesthesia was obtained, his right  shoulder was examined.  He had a  full range of motion and shoulder with  stable ligamentous exam.  Subacromial space and joint was injected under  sterile conditions with 0.25% Marcaine with epinephrine.  He was placed  in a beach chair position and shoulder and arm was prepped using sterile  DuraPrep and draped using sterile technique.  Originally through a  posterior arthroscopic portal the arthroscope with a pump attached was  placed into an anterior portal an arthroscopic probe was placed.  On  initial inspection the articular cartilage in the glenohumeral joint  showed 50-75% grade III chondromalacia which was debrided.  He had  partial tearing of the anterior, inferior, posterior and superior labrum  and 25-30% which was debrided.  The anterior inferior glenohumeral  ligament and complex was intact.  Biceps tendon anchor and biceps tendon  was intact.  Rotator cuff showed partial tearing 30-40% of the  supraspinatus and infraspinatus and this was partially debrided  arthroscopically.  It was still well attached otherwise.  The rest of  the rotator cuff was intact.  Inferior capsular recess free of  pathology.  Subacromial space was entered and a lateral arthroscopic  portal was made.  Moderately  thickened bursitis was resected.  The  rotator cuff was inflamed and thickened on the bursal surface but no  evidence of a tear.  Impingement was noted and subacromial decompression  was carried out, removing 6 mm of the undersurface of the anterior,  anterolateral and anteromedial acromion and CA ligament release carried  out as well.  The [facility] joint showed significant spurring and degenerative  changes and the distal 5-6 mm of clavicle was resected with a 6 mm bur.  After this was done, shoulder could be brought through a full range of  motion with no impingement on the rotator cuff.  At this point, it was  felt that all pathology had been satisfactorily addressed.  The  instruments were removed.  Portals closed with 3-0  nylon suture.  The  wound injected 0.25% Marcaine with epinephrine.  Sterile dressings and a  sling applied.  The patient awakened and taken to recovery room in  stable condition.   FOLLOW-UP CARE:  Dean Wilkins will be followed as an outpatient on  Vicodin and Naprosyn with early physical therapy.  See me back in the  office in a week for sutures out and follow-up.      Robert A. Thurston Hole, M.D.  Electronically Signed     RAW/MEDQ  D:  10/13/2006  T:  10/13/2006  Job:  [id number]

## 2011-04-17 LAB — LIPID PANEL
Cholesterol: 132
HDL: 35 — ABNORMAL LOW
Total CHOL/HDL Ratio: 3.8
VLDL: 19

## 2011-04-17 LAB — COMPREHENSIVE METABOLIC PANEL
ALT: 36
AST: 28
Albumin: 4.3
Calcium: 9.6
GFR calc Af Amer: >60
Glucose, Bld: 114 — ABNORMAL HIGH
Sodium: 136
Total Protein: 6.6

## 2011-04-17 LAB — BASIC METABOLIC PANEL
CO2: 25
CO2: 26
Chloride: 106
GFR calc Af Amer: >60
Glucose, Bld: 93
Potassium: 3.5
Potassium: 4
Sodium: 135
Sodium: 137

## 2011-04-17 LAB — CARDIAC PANEL(CRET KIN+CKTOT+MB+TROPI)
CK, MB: 1.3
Relative Index: INVALID
Total CK: 40
Total CK: 54
Troponin I: 0.03

## 2011-04-17 LAB — CBC
Hemoglobin: 15.6
Hemoglobin: 16.3
MCHC: 34.2
MCHC: 34.6
MCV: 95.5
Platelets: 149 — ABNORMAL LOW
RBC: 4.72
RBC: 4.82
RDW: 13.7

## 2011-04-17 LAB — B-NATRIURETIC PEPTIDE (CONVERTED LAB): Pro B Natriuretic peptide (BNP): <30

## 2011-04-17 LAB — TSH: TSH: 1.083

## 2011-04-17 LAB — CK TOTAL AND CKMB (NOT AT ARMC): Total CK: 35

## 2011-04-17 LAB — PROTIME-INR: Prothrombin Time: 13.1

## 2011-05-06 LAB — COMPREHENSIVE METABOLIC PANEL
Albumin: 3.7
Alkaline Phosphatase: 53
BUN: 10
CO2: 25
Chloride: 102
Creatinine, Ser: 0.97
GFR calc non Af Amer: >60
Glucose, Bld: 98
Potassium: 3.7
Total Bilirubin: 1.9 — ABNORMAL HIGH

## 2011-05-06 LAB — CBC
HCT: 41
HCT: 44.9
Hemoglobin: 14.3
Hemoglobin: 14.8
Hemoglobin: 15.5
Hemoglobin: 16.8
MCHC: 34.4
MCHC: 34.4
MCHC: 34.8
MCV: 93.4
Platelets: 162
RBC: 4.39
RBC: 4.58
RBC: 4.82
RBC: 5.21
RDW: 13.1
RDW: 13.4
WBC: 5.5
WBC: 5.5
WBC: 5.7

## 2011-05-06 LAB — URINALYSIS, ROUTINE W REFLEX MICROSCOPIC
Hgb urine dipstick: NEGATIVE
Nitrite: NEGATIVE
Specific Gravity, Urine: 1.019
Urobilinogen, UA: 1

## 2011-05-06 LAB — BASIC METABOLIC PANEL
CO2: 28
Calcium: 8.7
Calcium: 9.5
Chloride: 106
Chloride: 108
GFR calc Af Amer: >60
GFR calc Af Amer: >60
GFR calc non Af Amer: >60
GFR calc non Af Amer: >60
Glucose, Bld: 100 — ABNORMAL HIGH
Glucose, Bld: 77
Potassium: 3.6
Sodium: 136
Sodium: 138
Sodium: 140

## 2011-05-06 LAB — CK TOTAL AND CKMB (NOT AT ARMC)
CK, MB: 2.2
CK, MB: 2.9
CK, MB: 3.2
Relative Index: INVALID
Relative Index: INVALID
Total CK: 70
Total CK: 76

## 2011-05-06 LAB — DIFFERENTIAL
Basophils Absolute: 0
Basophils Relative: 0
Lymphocytes Relative: 26
Monocytes Absolute: 0.5
Neutro Abs: 3.7

## 2011-05-06 LAB — TROPONIN I
Troponin I: 0.23 — ABNORMAL HIGH
Troponin I: 0.25 — ABNORMAL HIGH
Troponin I: 0.52

## 2011-05-06 LAB — LIPID PANEL
Cholesterol: 130
LDL Cholesterol: 72
Total CHOL/HDL Ratio: 3.8
VLDL: 24

## 2011-05-06 LAB — PROTIME-INR: INR: 1.1

## 2011-08-20 DIAGNOSIS — H2510 Age-related nuclear cataract, unspecified eye: Secondary | ICD-10-CM | POA: Diagnosis not present

## 2011-12-12 DIAGNOSIS — E782 Mixed hyperlipidemia: Secondary | ICD-10-CM | POA: Diagnosis not present

## 2011-12-12 DIAGNOSIS — I2510 Atherosclerotic heart disease of native coronary artery without angina pectoris: Secondary | ICD-10-CM | POA: Diagnosis not present

## 2011-12-12 DIAGNOSIS — Z951 Presence of aortocoronary bypass graft: Secondary | ICD-10-CM | POA: Diagnosis not present

## 2011-12-31 DIAGNOSIS — E785 Hyperlipidemia, unspecified: Secondary | ICD-10-CM | POA: Diagnosis not present

## 2011-12-31 DIAGNOSIS — R6889 Other general symptoms and signs: Secondary | ICD-10-CM | POA: Diagnosis not present

## 2011-12-31 DIAGNOSIS — R5381 Other malaise: Secondary | ICD-10-CM | POA: Diagnosis not present

## 2012-05-26 DIAGNOSIS — R609 Edema, unspecified: Secondary | ICD-10-CM | POA: Diagnosis not present

## 2012-05-26 DIAGNOSIS — I10 Essential (primary) hypertension: Secondary | ICD-10-CM | POA: Diagnosis not present

## 2012-05-26 DIAGNOSIS — E782 Mixed hyperlipidemia: Secondary | ICD-10-CM | POA: Diagnosis not present

## 2012-05-26 DIAGNOSIS — Z951 Presence of aortocoronary bypass graft: Secondary | ICD-10-CM | POA: Diagnosis not present

## 2012-06-26 ENCOUNTER — Other Ambulatory Visit (HOSPITAL_COMMUNITY): Payer: Self-pay | Admitting: Cardiovascular Disease

## 2012-06-26 DIAGNOSIS — R079 Chest pain, unspecified: Secondary | ICD-10-CM

## 2012-06-26 DIAGNOSIS — I2510 Atherosclerotic heart disease of native coronary artery without angina pectoris: Secondary | ICD-10-CM

## 2012-07-02 ENCOUNTER — Ambulatory Visit (HOSPITAL_COMMUNITY)
Admission: RE | Admit: 2012-07-02 | Discharge: 2012-07-02 | Disposition: A | Discharge status: Home / Self Care | Payer: Medicare Other | Source: Ambulatory Visit | Attending: Cardiovascular Disease | Admitting: Cardiovascular Disease

## 2012-07-02 DIAGNOSIS — R079 Chest pain, unspecified: Secondary | ICD-10-CM | POA: Insufficient documentation

## 2012-07-02 DIAGNOSIS — I2510 Atherosclerotic heart disease of native coronary artery without angina pectoris: Secondary | ICD-10-CM | POA: Diagnosis not present

## 2012-07-02 MED ORDER — TECHNETIUM TC 99M SESTAMIBI GENERIC - CARDIOLITE
11.0000 | Freq: Once | INTRAVENOUS | Status: AC | PRN
  Administered 2012-07-02: 11 via INTRAVENOUS

## 2012-07-02 MED ORDER — REGADENOSON 0.4 MG/5ML IV SOLN
0.4000 mg | Freq: Once | INTRAVENOUS | Status: AC
  Administered 2012-07-02: 0.4 mg via INTRAVENOUS

## 2012-07-02 MED ORDER — TECHNETIUM TC 99M SESTAMIBI GENERIC - CARDIOLITE
31.0000 | Freq: Once | INTRAVENOUS | Status: AC | PRN
  Administered 2012-07-02: 31 via INTRAVENOUS

## 2012-07-02 NOTE — Procedures (Addendum)
[name] [address] [place] [address] [place] [address] [phone]  Cardiology Nuclear Med Study  [address] Dean Wilkins is a 66 y.o. male     MRN : [id number]     DOB: Nov 30, 1945  Procedure Date: 07/02/2012  Nuclear Med Background Indication for Stress Test:  Evaluation for Ischemia, Graft Patency and Stent Patency History:  2010, 09, 08, 00 PTCA Stent- RCA, 2008 R/S Myoview EF 47% Abnormal, 1999 CABG x 4 Cardiac Risk Factors: Family History - CAD, Hypertension, Lipids, Obesity and Smoker  Symptoms:  Chest Pain   Nuclear Pre-Procedure Caffeine/Decaff Intake:  None NPO After: 9:00am   IV Site: R Antecubital  IV 0.9% NS with Angio Cath:  22g  Chest Size (in): 46 IV Started by: Bonnita Levan, RN  Height: 6'1'' Cup Size: N/A  BMI:  Body mass index is 31.66 kg/(m^2). Weight:  240 lbs   Tech Comments:  N/A    Nuclear Med Study 1 or 2 day study: 1 day  Stress Test Type:  Lexiscan  Order Authorizing Provider:  Susa Griffins, MD   Resting Radionuclide: Technetium 38m Sestamibi  Resting Radionuclide Dose:11.0 mCi   Stress Radionuclide:  Technetium 1m Sestamibi  Stress Radionuclide Dose: 31.0 mCi           Stress Protocol Rest HR: 55 Stress HR: 86  Rest BP: 133/89 Stress BP: 142/96  Exercise Time (min): n/a METS: n/a   Predicted Max HR: 153 bpm % Max HR: 56.21 bpm Rate Pressure Product: 44010   Dose of Adenosine (mg):  n/a Dose of Lexiscan: 0.4 mg  Dose of Atropine (mg): n/a Dose of Dobutamine: n/a mcg/kg/min (at max HR)  Stress Test Technologist: Esperanza Sheets, CCT Nuclear Technologist: Gonzella Lex, CNMT   Rest Procedure:  Myocardial perfusion imaging was performed at rest 45 minutes following the intravenous administration of Technetium 28m Sestamibi. Stress Procedure:  The patient received IV Lexiscan 0.4 mg over 15-seconds.  Technetium 36m Sestamibi injected at 30-seconds.  There were no significant changes with Lexiscan.   Quantitative spect images were obtained after a 45 minute delay.  Transient Ischemic Dilatation (Normal <1.22):  0.98 Lung/Heart Ratio (Normal <0.45):  0.27 QGS EDV:  117 ml. QGS ESV:  51 ml LV Ejection Fraction: 56%         Rest ECG: NSR - Normal EKG  Stress ECG: No significant change from baseline ECG  QPS Raw Data Images:  Normal; no motion artifact; normal heart/lung ratio. Stress Images:  There is a small areea of transmural decrease in uptake anterolateral wall towards the base Rest Images:  Comparison with the stress images reveals no significant change. Subtraction (SDS):  There is a small anterolateral wall scar  Impression Exercise Capacity:  Lexiscan with no exercise. BP Response:  Normal blood pressure response. Clinical Symptoms:  No significant symptoms noted. ECG Impression:  No significant ST segment change suggestive of ischemia. Comparison with Prior Nuclear Study: No significant change from previous study  Overall Impression:  Low risk stress nuclear study.  LV Wall Motion:  Nl overall LV fxn with mild anterlateral hypokinesis   Runell Gess, MD  07/03/2012 5:57 PM

## 2012-11-12 ENCOUNTER — Encounter: Payer: Self-pay | Admitting: Cardiovascular Disease

## 2012-12-08 ENCOUNTER — Other Ambulatory Visit: Payer: Self-pay | Admitting: *Deleted

## 2012-12-08 MED ORDER — CLOPIDOGREL BISULFATE 75 MG PO TABS: 75.0000 mg | ORAL_TABLET | Freq: Every day | ORAL | Status: DC

## 2012-12-08 NOTE — Telephone Encounter (Signed)
rx printed and faxed manually to [facility]

## 2013-01-06 ENCOUNTER — Encounter: Payer: Self-pay | Admitting: Cardiovascular Disease

## 2013-01-06 DIAGNOSIS — I10 Essential (primary) hypertension: Secondary | ICD-10-CM | POA: Diagnosis not present

## 2013-01-06 DIAGNOSIS — E782 Mixed hyperlipidemia: Secondary | ICD-10-CM | POA: Diagnosis not present

## 2013-01-06 DIAGNOSIS — I2510 Atherosclerotic heart disease of native coronary artery without angina pectoris: Secondary | ICD-10-CM | POA: Diagnosis not present

## 2013-01-08 ENCOUNTER — Other Ambulatory Visit (HOSPITAL_COMMUNITY): Payer: Self-pay | Admitting: Cardiovascular Disease

## 2013-01-08 DIAGNOSIS — I350 Nonrheumatic aortic (valve) stenosis: Secondary | ICD-10-CM

## 2013-01-08 DIAGNOSIS — I70219 Atherosclerosis of native arteries of extremities with intermittent claudication, unspecified extremity: Secondary | ICD-10-CM

## 2013-01-08 DIAGNOSIS — R011 Cardiac murmur, unspecified: Secondary | ICD-10-CM

## 2013-01-12 ENCOUNTER — Encounter: Payer: Self-pay | Admitting: Cardiovascular Disease

## 2013-01-12 DIAGNOSIS — R6889 Other general symptoms and signs: Secondary | ICD-10-CM | POA: Diagnosis not present

## 2013-01-12 DIAGNOSIS — R5381 Other malaise: Secondary | ICD-10-CM | POA: Diagnosis not present

## 2013-01-12 DIAGNOSIS — E782 Mixed hyperlipidemia: Secondary | ICD-10-CM | POA: Diagnosis not present

## 2013-01-12 DIAGNOSIS — Z125 Encounter for screening for malignant neoplasm of prostate: Secondary | ICD-10-CM | POA: Diagnosis not present

## 2013-02-04 ENCOUNTER — Telehealth: Payer: Self-pay | Admitting: Cardiovascular Disease

## 2013-02-04 NOTE — Telephone Encounter (Signed)
PLEASE CALL-CONCERNING HIS MEDICINE-SAID HE MIGHT NEED YOU TO MAIL HIS PRESCRIPTION!

## 2013-02-05 ENCOUNTER — Telehealth: Payer: Self-pay | Admitting: Cardiovascular Disease

## 2013-02-05 NOTE — Telephone Encounter (Signed)
Please call about his fluid pill

## 2013-02-08 DIAGNOSIS — I10 Essential (primary) hypertension: Secondary | ICD-10-CM | POA: Diagnosis not present

## 2013-02-08 DIAGNOSIS — E785 Hyperlipidemia, unspecified: Secondary | ICD-10-CM | POA: Diagnosis not present

## 2013-02-08 DIAGNOSIS — I2510 Atherosclerotic heart disease of native coronary artery without angina pectoris: Secondary | ICD-10-CM | POA: Diagnosis not present

## 2013-02-24 NOTE — Telephone Encounter (Signed)
Pt. Called no answer lmom 

## 2013-03-12 DIAGNOSIS — Z7902 Long term (current) use of antithrombotics/antiplatelets: Secondary | ICD-10-CM | POA: Diagnosis not present

## 2013-04-15 DIAGNOSIS — H2510 Age-related nuclear cataract, unspecified eye: Secondary | ICD-10-CM | POA: Diagnosis not present

## 2013-06-25 DIAGNOSIS — I2510 Atherosclerotic heart disease of native coronary artery without angina pectoris: Secondary | ICD-10-CM | POA: Diagnosis not present

## 2013-06-25 DIAGNOSIS — Z9889 Other specified postprocedural states: Secondary | ICD-10-CM | POA: Diagnosis not present

## 2013-06-25 DIAGNOSIS — I252 Old myocardial infarction: Secondary | ICD-10-CM | POA: Diagnosis not present

## 2013-06-25 DIAGNOSIS — I10 Essential (primary) hypertension: Secondary | ICD-10-CM | POA: Diagnosis not present

## 2013-07-01 ENCOUNTER — Ambulatory Visit (HOSPITAL_COMMUNITY): Payer: Medicare Other

## 2013-07-01 ENCOUNTER — Encounter (HOSPITAL_COMMUNITY): Payer: Medicare Other

## 2013-07-08 ENCOUNTER — Ambulatory Visit (HOSPITAL_COMMUNITY): Payer: Medicare Other

## 2013-07-08 ENCOUNTER — Encounter (HOSPITAL_COMMUNITY): Payer: Medicare Other

## 2013-07-08 DIAGNOSIS — I2510 Atherosclerotic heart disease of native coronary artery without angina pectoris: Secondary | ICD-10-CM | POA: Diagnosis not present

## 2013-07-08 DIAGNOSIS — I10 Essential (primary) hypertension: Secondary | ICD-10-CM | POA: Diagnosis not present

## 2013-07-08 DIAGNOSIS — E785 Hyperlipidemia, unspecified: Secondary | ICD-10-CM | POA: Diagnosis not present

## 2013-08-10 DIAGNOSIS — D696 Thrombocytopenia, unspecified: Secondary | ICD-10-CM | POA: Diagnosis not present

## 2013-08-10 DIAGNOSIS — E785 Hyperlipidemia, unspecified: Secondary | ICD-10-CM | POA: Diagnosis not present

## 2013-08-10 DIAGNOSIS — I10 Essential (primary) hypertension: Secondary | ICD-10-CM | POA: Diagnosis not present

## 2013-08-10 DIAGNOSIS — I2510 Atherosclerotic heart disease of native coronary artery without angina pectoris: Secondary | ICD-10-CM | POA: Diagnosis not present

## 2014-01-17 DIAGNOSIS — C61 Malignant neoplasm of prostate: Secondary | ICD-10-CM | POA: Diagnosis not present

## 2014-01-17 DIAGNOSIS — E785 Hyperlipidemia, unspecified: Secondary | ICD-10-CM | POA: Diagnosis not present

## 2014-01-17 DIAGNOSIS — D696 Thrombocytopenia, unspecified: Secondary | ICD-10-CM | POA: Diagnosis not present

## 2014-01-17 DIAGNOSIS — Z125 Encounter for screening for malignant neoplasm of prostate: Secondary | ICD-10-CM | POA: Diagnosis not present

## 2014-01-17 DIAGNOSIS — E782 Mixed hyperlipidemia: Secondary | ICD-10-CM | POA: Diagnosis not present

## 2014-01-17 DIAGNOSIS — I10 Essential (primary) hypertension: Secondary | ICD-10-CM | POA: Diagnosis not present

## 2014-01-17 DIAGNOSIS — I2510 Atherosclerotic heart disease of native coronary artery without angina pectoris: Secondary | ICD-10-CM | POA: Diagnosis not present

## 2014-02-07 DIAGNOSIS — E785 Hyperlipidemia, unspecified: Secondary | ICD-10-CM | POA: Diagnosis not present

## 2014-02-07 DIAGNOSIS — D696 Thrombocytopenia, unspecified: Secondary | ICD-10-CM | POA: Diagnosis not present

## 2014-02-07 DIAGNOSIS — I2510 Atherosclerotic heart disease of native coronary artery without angina pectoris: Secondary | ICD-10-CM | POA: Diagnosis not present

## 2014-05-17 DIAGNOSIS — H2513 Age-related nuclear cataract, bilateral: Secondary | ICD-10-CM | POA: Diagnosis not present

## 2014-07-12 DIAGNOSIS — D6959 Other secondary thrombocytopenia: Secondary | ICD-10-CM | POA: Diagnosis not present

## 2014-07-12 DIAGNOSIS — D696 Thrombocytopenia, unspecified: Secondary | ICD-10-CM | POA: Diagnosis not present

## 2014-07-12 DIAGNOSIS — I10 Essential (primary) hypertension: Secondary | ICD-10-CM | POA: Diagnosis not present

## 2014-07-12 DIAGNOSIS — I2510 Atherosclerotic heart disease of native coronary artery without angina pectoris: Secondary | ICD-10-CM | POA: Diagnosis not present

## 2014-07-12 DIAGNOSIS — E785 Hyperlipidemia, unspecified: Secondary | ICD-10-CM | POA: Diagnosis not present

## 2015-01-11 DIAGNOSIS — D6959 Other secondary thrombocytopenia: Secondary | ICD-10-CM | POA: Diagnosis not present

## 2015-01-11 DIAGNOSIS — Z136 Encounter for screening for cardiovascular disorders: Secondary | ICD-10-CM | POA: Diagnosis not present

## 2015-01-11 DIAGNOSIS — Z0000 Encounter for general adult medical examination without abnormal findings: Secondary | ICD-10-CM | POA: Diagnosis not present

## 2015-01-11 DIAGNOSIS — D696 Thrombocytopenia, unspecified: Secondary | ICD-10-CM | POA: Diagnosis not present

## 2015-01-11 DIAGNOSIS — I2510 Atherosclerotic heart disease of native coronary artery without angina pectoris: Secondary | ICD-10-CM | POA: Diagnosis not present

## 2015-01-11 DIAGNOSIS — Z13220 Encounter for screening for lipoid disorders: Secondary | ICD-10-CM | POA: Diagnosis not present

## 2015-01-11 DIAGNOSIS — I10 Essential (primary) hypertension: Secondary | ICD-10-CM | POA: Diagnosis not present

## 2015-01-11 DIAGNOSIS — Z131 Encounter for screening for diabetes mellitus: Secondary | ICD-10-CM | POA: Diagnosis not present

## 2015-01-11 DIAGNOSIS — E785 Hyperlipidemia, unspecified: Secondary | ICD-10-CM | POA: Diagnosis not present

## 2015-01-16 DIAGNOSIS — R195 Other fecal abnormalities: Secondary | ICD-10-CM | POA: Diagnosis not present

## 2015-01-16 DIAGNOSIS — K5730 Diverticulosis of large intestine without perforation or abscess without bleeding: Secondary | ICD-10-CM | POA: Diagnosis not present

## 2015-02-24 DIAGNOSIS — E785 Hyperlipidemia, unspecified: Secondary | ICD-10-CM | POA: Diagnosis not present

## 2015-02-24 DIAGNOSIS — D696 Thrombocytopenia, unspecified: Secondary | ICD-10-CM | POA: Diagnosis not present

## 2015-02-24 DIAGNOSIS — Z136 Encounter for screening for cardiovascular disorders: Secondary | ICD-10-CM | POA: Diagnosis not present

## 2015-02-24 DIAGNOSIS — I2510 Atherosclerotic heart disease of native coronary artery without angina pectoris: Secondary | ICD-10-CM | POA: Diagnosis not present

## 2015-03-21 DIAGNOSIS — Z136 Encounter for screening for cardiovascular disorders: Secondary | ICD-10-CM | POA: Diagnosis not present

## 2015-07-13 DIAGNOSIS — I2510 Atherosclerotic heart disease of native coronary artery without angina pectoris: Secondary | ICD-10-CM | POA: Diagnosis not present

## 2015-07-13 DIAGNOSIS — I10 Essential (primary) hypertension: Secondary | ICD-10-CM | POA: Diagnosis not present

## 2015-07-13 DIAGNOSIS — D649 Anemia, unspecified: Secondary | ICD-10-CM | POA: Diagnosis not present

## 2015-07-13 DIAGNOSIS — D696 Thrombocytopenia, unspecified: Secondary | ICD-10-CM | POA: Diagnosis not present

## 2015-07-13 DIAGNOSIS — E785 Hyperlipidemia, unspecified: Secondary | ICD-10-CM | POA: Diagnosis not present

## 2015-07-13 DIAGNOSIS — R799 Abnormal finding of blood chemistry, unspecified: Secondary | ICD-10-CM | POA: Diagnosis not present

## 2015-08-07 DIAGNOSIS — K5730 Diverticulosis of large intestine without perforation or abscess without bleeding: Secondary | ICD-10-CM | POA: Diagnosis not present

## 2015-08-18 DIAGNOSIS — F17220 Nicotine dependence, chewing tobacco, uncomplicated: Secondary | ICD-10-CM | POA: Diagnosis not present

## 2015-08-18 DIAGNOSIS — I252 Old myocardial infarction: Secondary | ICD-10-CM | POA: Diagnosis not present

## 2015-08-18 DIAGNOSIS — Z79899 Other long term (current) drug therapy: Secondary | ICD-10-CM | POA: Diagnosis not present

## 2015-08-18 DIAGNOSIS — Z951 Presence of aortocoronary bypass graft: Secondary | ICD-10-CM | POA: Diagnosis not present

## 2015-08-18 DIAGNOSIS — K5730 Diverticulosis of large intestine without perforation or abscess without bleeding: Secondary | ICD-10-CM | POA: Diagnosis not present

## 2015-08-18 DIAGNOSIS — Z1211 Encounter for screening for malignant neoplasm of colon: Secondary | ICD-10-CM | POA: Diagnosis not present

## 2015-08-18 DIAGNOSIS — Z86010 Personal history of colonic polyps: Secondary | ICD-10-CM | POA: Diagnosis not present

## 2015-08-18 DIAGNOSIS — Z955 Presence of coronary angioplasty implant and graft: Secondary | ICD-10-CM | POA: Diagnosis not present

## 2015-08-18 DIAGNOSIS — I10 Essential (primary) hypertension: Secondary | ICD-10-CM | POA: Diagnosis not present

## 2016-01-12 DIAGNOSIS — Z125 Encounter for screening for malignant neoplasm of prostate: Secondary | ICD-10-CM | POA: Diagnosis not present

## 2016-01-12 DIAGNOSIS — I10 Essential (primary) hypertension: Secondary | ICD-10-CM | POA: Diagnosis not present

## 2016-01-12 DIAGNOSIS — D649 Anemia, unspecified: Secondary | ICD-10-CM | POA: Diagnosis not present

## 2016-01-12 DIAGNOSIS — D696 Thrombocytopenia, unspecified: Secondary | ICD-10-CM | POA: Diagnosis not present

## 2016-01-12 DIAGNOSIS — Z8042 Family history of malignant neoplasm of prostate: Secondary | ICD-10-CM | POA: Diagnosis not present

## 2016-01-12 DIAGNOSIS — E785 Hyperlipidemia, unspecified: Secondary | ICD-10-CM | POA: Diagnosis not present

## 2016-01-12 DIAGNOSIS — Z13818 Encounter for screening for other digestive system disorders: Secondary | ICD-10-CM | POA: Diagnosis not present

## 2016-01-12 DIAGNOSIS — Z0000 Encounter for general adult medical examination without abnormal findings: Secondary | ICD-10-CM | POA: Diagnosis not present

## 2016-02-08 DIAGNOSIS — I25119 Atherosclerotic heart disease of native coronary artery with unspecified angina pectoris: Secondary | ICD-10-CM | POA: Diagnosis not present

## 2016-02-08 DIAGNOSIS — E785 Hyperlipidemia, unspecified: Secondary | ICD-10-CM | POA: Diagnosis not present

## 2016-02-08 DIAGNOSIS — Z136 Encounter for screening for cardiovascular disorders: Secondary | ICD-10-CM | POA: Diagnosis not present

## 2016-02-08 DIAGNOSIS — D696 Thrombocytopenia, unspecified: Secondary | ICD-10-CM | POA: Diagnosis not present

## 2016-04-22 DIAGNOSIS — H2513 Age-related nuclear cataract, bilateral: Secondary | ICD-10-CM | POA: Diagnosis not present

## 2016-08-15 DIAGNOSIS — I2510 Atherosclerotic heart disease of native coronary artery without angina pectoris: Secondary | ICD-10-CM | POA: Diagnosis not present

## 2016-08-15 DIAGNOSIS — I10 Essential (primary) hypertension: Secondary | ICD-10-CM | POA: Diagnosis not present

## 2016-08-15 DIAGNOSIS — D649 Anemia, unspecified: Secondary | ICD-10-CM | POA: Diagnosis not present

## 2016-08-15 DIAGNOSIS — D696 Thrombocytopenia, unspecified: Secondary | ICD-10-CM | POA: Diagnosis not present

## 2016-08-15 DIAGNOSIS — E785 Hyperlipidemia, unspecified: Secondary | ICD-10-CM | POA: Diagnosis not present

## 2016-12-19 DIAGNOSIS — M5136 Other intervertebral disc degeneration, lumbar region: Secondary | ICD-10-CM | POA: Diagnosis not present

## 2016-12-19 DIAGNOSIS — M9903 Segmental and somatic dysfunction of lumbar region: Secondary | ICD-10-CM | POA: Diagnosis not present

## 2016-12-23 DIAGNOSIS — M5136 Other intervertebral disc degeneration, lumbar region: Secondary | ICD-10-CM | POA: Diagnosis not present

## 2016-12-23 DIAGNOSIS — M9903 Segmental and somatic dysfunction of lumbar region: Secondary | ICD-10-CM | POA: Diagnosis not present

## 2016-12-25 DIAGNOSIS — M9903 Segmental and somatic dysfunction of lumbar region: Secondary | ICD-10-CM | POA: Diagnosis not present

## 2016-12-25 DIAGNOSIS — M5136 Other intervertebral disc degeneration, lumbar region: Secondary | ICD-10-CM | POA: Diagnosis not present

## 2016-12-26 DIAGNOSIS — M9903 Segmental and somatic dysfunction of lumbar region: Secondary | ICD-10-CM | POA: Diagnosis not present

## 2016-12-26 DIAGNOSIS — M5136 Other intervertebral disc degeneration, lumbar region: Secondary | ICD-10-CM | POA: Diagnosis not present

## 2016-12-30 DIAGNOSIS — M5136 Other intervertebral disc degeneration, lumbar region: Secondary | ICD-10-CM | POA: Diagnosis not present

## 2016-12-30 DIAGNOSIS — M9903 Segmental and somatic dysfunction of lumbar region: Secondary | ICD-10-CM | POA: Diagnosis not present

## 2017-01-01 DIAGNOSIS — M5136 Other intervertebral disc degeneration, lumbar region: Secondary | ICD-10-CM | POA: Diagnosis not present

## 2017-01-01 DIAGNOSIS — M9903 Segmental and somatic dysfunction of lumbar region: Secondary | ICD-10-CM | POA: Diagnosis not present

## 2017-01-02 DIAGNOSIS — M9903 Segmental and somatic dysfunction of lumbar region: Secondary | ICD-10-CM | POA: Diagnosis not present

## 2017-01-02 DIAGNOSIS — M5136 Other intervertebral disc degeneration, lumbar region: Secondary | ICD-10-CM | POA: Diagnosis not present

## 2017-01-06 DIAGNOSIS — I10 Essential (primary) hypertension: Secondary | ICD-10-CM | POA: Diagnosis not present

## 2017-01-06 DIAGNOSIS — E785 Hyperlipidemia, unspecified: Secondary | ICD-10-CM | POA: Diagnosis not present

## 2017-01-06 DIAGNOSIS — Q7649 Other congenital malformations of spine, not associated with scoliosis: Secondary | ICD-10-CM | POA: Diagnosis not present

## 2017-01-06 DIAGNOSIS — M5416 Radiculopathy, lumbar region: Secondary | ICD-10-CM | POA: Diagnosis not present

## 2017-01-06 DIAGNOSIS — M48061 Spinal stenosis, lumbar region without neurogenic claudication: Secondary | ICD-10-CM | POA: Diagnosis not present

## 2017-01-06 DIAGNOSIS — M5430 Sciatica, unspecified side: Secondary | ICD-10-CM | POA: Diagnosis not present

## 2017-01-06 DIAGNOSIS — I2510 Atherosclerotic heart disease of native coronary artery without angina pectoris: Secondary | ICD-10-CM | POA: Diagnosis not present

## 2017-01-07 DIAGNOSIS — M5416 Radiculopathy, lumbar region: Secondary | ICD-10-CM | POA: Diagnosis not present

## 2017-01-10 DIAGNOSIS — M5416 Radiculopathy, lumbar region: Secondary | ICD-10-CM | POA: Diagnosis not present

## 2017-01-10 DIAGNOSIS — M48061 Spinal stenosis, lumbar region without neurogenic claudication: Secondary | ICD-10-CM | POA: Diagnosis not present

## 2017-01-29 DIAGNOSIS — Z131 Encounter for screening for diabetes mellitus: Secondary | ICD-10-CM | POA: Diagnosis not present

## 2017-01-29 DIAGNOSIS — Z0000 Encounter for general adult medical examination without abnormal findings: Secondary | ICD-10-CM | POA: Diagnosis not present

## 2017-01-29 DIAGNOSIS — R799 Abnormal finding of blood chemistry, unspecified: Secondary | ICD-10-CM | POA: Diagnosis not present

## 2017-01-29 DIAGNOSIS — E785 Hyperlipidemia, unspecified: Secondary | ICD-10-CM | POA: Diagnosis not present

## 2017-01-29 DIAGNOSIS — D649 Anemia, unspecified: Secondary | ICD-10-CM | POA: Diagnosis not present

## 2017-01-29 DIAGNOSIS — I10 Essential (primary) hypertension: Secondary | ICD-10-CM | POA: Diagnosis not present

## 2017-01-29 DIAGNOSIS — I2510 Atherosclerotic heart disease of native coronary artery without angina pectoris: Secondary | ICD-10-CM | POA: Diagnosis not present

## 2017-01-29 DIAGNOSIS — Z136 Encounter for screening for cardiovascular disorders: Secondary | ICD-10-CM | POA: Diagnosis not present

## 2017-01-29 DIAGNOSIS — Z13220 Encounter for screening for lipoid disorders: Secondary | ICD-10-CM | POA: Diagnosis not present

## 2017-01-29 DIAGNOSIS — D696 Thrombocytopenia, unspecified: Secondary | ICD-10-CM | POA: Diagnosis not present

## 2017-02-06 DIAGNOSIS — D696 Thrombocytopenia, unspecified: Secondary | ICD-10-CM | POA: Diagnosis not present

## 2017-02-06 DIAGNOSIS — E785 Hyperlipidemia, unspecified: Secondary | ICD-10-CM | POA: Diagnosis not present

## 2017-02-06 DIAGNOSIS — I25119 Atherosclerotic heart disease of native coronary artery with unspecified angina pectoris: Secondary | ICD-10-CM | POA: Diagnosis not present

## 2017-02-14 DIAGNOSIS — I10 Essential (primary) hypertension: Secondary | ICD-10-CM | POA: Diagnosis not present

## 2017-02-14 DIAGNOSIS — D649 Anemia, unspecified: Secondary | ICD-10-CM | POA: Diagnosis not present

## 2017-02-14 DIAGNOSIS — E785 Hyperlipidemia, unspecified: Secondary | ICD-10-CM | POA: Diagnosis not present

## 2017-02-17 DIAGNOSIS — M5416 Radiculopathy, lumbar region: Secondary | ICD-10-CM | POA: Diagnosis not present

## 2017-03-18 DIAGNOSIS — H52223 Regular astigmatism, bilateral: Secondary | ICD-10-CM | POA: Diagnosis not present

## 2017-03-18 DIAGNOSIS — H5203 Hypermetropia, bilateral: Secondary | ICD-10-CM | POA: Diagnosis not present

## 2017-03-18 DIAGNOSIS — I10 Essential (primary) hypertension: Secondary | ICD-10-CM | POA: Diagnosis not present

## 2017-03-18 DIAGNOSIS — H524 Presbyopia: Secondary | ICD-10-CM | POA: Diagnosis not present

## 2017-03-18 DIAGNOSIS — H25813 Combined forms of age-related cataract, bilateral: Secondary | ICD-10-CM | POA: Diagnosis not present

## 2017-04-03 DIAGNOSIS — I2510 Atherosclerotic heart disease of native coronary artery without angina pectoris: Secondary | ICD-10-CM | POA: Diagnosis not present

## 2017-04-03 DIAGNOSIS — R531 Weakness: Secondary | ICD-10-CM | POA: Diagnosis not present

## 2017-04-03 DIAGNOSIS — R001 Bradycardia, unspecified: Secondary | ICD-10-CM | POA: Diagnosis not present

## 2017-04-07 DIAGNOSIS — R002 Palpitations: Secondary | ICD-10-CM | POA: Diagnosis not present

## 2017-04-21 DIAGNOSIS — G4733 Obstructive sleep apnea (adult) (pediatric): Secondary | ICD-10-CM | POA: Diagnosis not present

## 2017-04-21 DIAGNOSIS — I25119 Atherosclerotic heart disease of native coronary artery with unspecified angina pectoris: Secondary | ICD-10-CM | POA: Diagnosis not present

## 2017-04-21 DIAGNOSIS — R002 Palpitations: Secondary | ICD-10-CM | POA: Diagnosis not present

## 2017-04-21 DIAGNOSIS — I472 Ventricular tachycardia: Secondary | ICD-10-CM | POA: Diagnosis not present

## 2017-04-25 DIAGNOSIS — I472 Ventricular tachycardia: Secondary | ICD-10-CM | POA: Diagnosis not present

## 2017-04-25 DIAGNOSIS — R002 Palpitations: Secondary | ICD-10-CM | POA: Diagnosis not present

## 2017-04-25 DIAGNOSIS — I10 Essential (primary) hypertension: Secondary | ICD-10-CM | POA: Diagnosis not present

## 2017-04-25 DIAGNOSIS — I2510 Atherosclerotic heart disease of native coronary artery without angina pectoris: Secondary | ICD-10-CM | POA: Diagnosis not present

## 2017-04-30 DIAGNOSIS — I472 Ventricular tachycardia: Secondary | ICD-10-CM | POA: Diagnosis not present

## 2017-04-30 DIAGNOSIS — R002 Palpitations: Secondary | ICD-10-CM | POA: Diagnosis not present

## 2017-04-30 DIAGNOSIS — I10 Essential (primary) hypertension: Secondary | ICD-10-CM | POA: Diagnosis not present

## 2017-05-05 DIAGNOSIS — I493 Ventricular premature depolarization: Secondary | ICD-10-CM | POA: Diagnosis not present

## 2017-05-05 DIAGNOSIS — I25119 Atherosclerotic heart disease of native coronary artery with unspecified angina pectoris: Secondary | ICD-10-CM | POA: Diagnosis not present

## 2017-05-05 DIAGNOSIS — G4733 Obstructive sleep apnea (adult) (pediatric): Secondary | ICD-10-CM | POA: Diagnosis not present

## 2017-05-05 DIAGNOSIS — I472 Ventricular tachycardia: Secondary | ICD-10-CM | POA: Diagnosis not present

## 2017-05-22 DIAGNOSIS — I25119 Atherosclerotic heart disease of native coronary artery with unspecified angina pectoris: Secondary | ICD-10-CM | POA: Diagnosis not present

## 2017-05-25 DIAGNOSIS — I2510 Atherosclerotic heart disease of native coronary artery without angina pectoris: Secondary | ICD-10-CM | POA: Diagnosis present

## 2017-05-25 NOTE — H&P (Signed)
OFFICE VISIT NOTES COPIED TO EPIC FOR DOCUMENTATION  . History of Present Illness Dean Page MD; 05/05/2017 12:48 PM) Patient words: Last OV 04/21/2017; FU test results.  The patient is a 71 year old male who presents for a Follow-up for CAD. Dean Wilkins is a caucasian male with hsitory of CAD with CABG in 1999, with an occluded RIMA to RCA and occluded graft to the OM, patent LIMA to LAD on followup he had the stents placed in his native RCA 2000, 2008, 2009, and 2002. He also has hyperlipidemia controlled on Zocor, hypertension, and erectile dysfunction.  Patient was seen on 04/21/2017 when he was referred to [place] for marked fatigue that occurred 3 weeks ago and heart rate was counted to be about 80 bpm to 35 bpm at his PCPs office with frequent PVCs on auscultation  He underwent Holter monitor which revealed very frequent episodes of PVCs, ventricular bigeminy, couplets and also episode of NSVT. She underwent nuclear stress test due to this, on 04/25/17 which is abnormal, echocardiogram on 04/30/17 revealed EF 45%. He now presents here for follow-up. Patient's dose of beta blocker was reduced when his heart rate was found to be 30-35 bpm. States fatigue has improved since decreasing Metoprolol dose.  Denies any chest pain, dyspnea, PND or orthopnea, symptoms to suggest TIA or claudication. No recent weight changes, No dark stools or bloody stools. He has mild chronic leg edema. His wife does state that he has loud snoring and as witnessed apneic episodes, has been set up for sleep study in [place] due to frequent PVC, NSVT and one episode of 10 beat at night.   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 Coronary angiogram 4/60/2010 PTCA, stenting of proximal RCA with a 2.5 x 23 mm Xience DES. History of mid RCA Cypher  stent and mid to distal Liberte stent placed remotely. Atherosclerosis of native coronary artery of native heart with angina pectoris (I25.119)  Coronary angiogram 10/25/2008 PTCA, stenting of proximal RCA with a 2.5 x 23 mm Xience DES. History of mid RCA Cypher stent and mid to distal Liberte stent placed remotely. H/O CABG 1999: SVG to RCA occluded, LIMA to LAD patent, occluded SVG to OM Echocardiogram 12/14/2010: Normal LV size, mild asymmetric LVH, LV systolic function normal. Inferoapical and inferoseptal hypokinesis. Mild LA dilatation. RV mildly dilated with normal function. Mild TR, normal PA pressure. Lexiscan myoview stress test 04/25/2017: 1. Prognostically, this is a moderate risk study. 2. Resting EKG shows NSR, frequent PVC. Stress EKG no change. V- Bigeminy and Quadragemini persisted. No ST-T changes. 3. The gated study showed the ejection fraction was 40-45%. There is lateral wall akinesis. There is a moderate-sized scar in the lateral wall extending from the mid ventricle towards the apex including the apical anterior wall. In addition there is very mild peri-infarct ischemia mostly towards mid anterolateral wall. Compared to 07/02/2012, ischemia new and EF 56% Essential hypertension, benign (I10)  Labs 01/29/2017: Sodium 128, potassium 4.7, BUN 10, creatinine 0.8 by, serum bilirubin 1.6, LFTs normal. EGFR greater than 60 him up. Total cholesterol 134, triglycerides 74, HDL 54, LDL 71. Non-HDL cholesterol 80. HB 16.2/HCT 47.2, mild macrocytosis present. Platelets 119. Labs 01/17/2014: CMP normal. Hyperlipidemia, mild (E78.5)  Abdominal aortic duplex 03/21/2015: No AAA observed. Mild atheresclerosis. History of MI (myocardial infarction) (I25.2)  Thrombocytopenia (D69.6)  Screening  for AAA (abdominal aortic aneurysm) (Z13.6)  Palpitations (R00.2)  Laboratory examination (Z01.89)  Labs 02/14/2017: Potassium 4.6, BUN 16, creatinine 0.92, eGFR greater than 60 mL. Serum bilirubin 1.6,  otherwise CMP normal. HB 16.2/HCT 47.2, mild macrocytosis present. Nitrates 119. Total cholesterol 134, triglycerides 74, HDL 54, LDL 54, non-HDL cholesterol 80. Sleep apnea, obstructive (G47.33)  NSVT (nonsustained ventricular tachycardia) (I47.2) [04/03/2017]: Holter Monitor 48 hours 04/03/2017: Symptomatic transmission of fatigue and weakness revealed normal sinus rhythm. There were frequent PVCs, brief episodes of ventricle bigeminy, there was a 9 beat run of ventricular tachycardia at a rate of 106 bpm noted at 12:30 AM. Total ventricular ectopy consisted of 38,000 beats in 48 hours with 300 PVCs in triplets, 5000 in couplets, 28,000 isolated PVC. Occasional PACs and atrial couplets were evident. No atrial fibrillation.  Allergies Anderson Malta Sergeant; 2017/05/07 12:05 PM) Lipitor *ANTIHYPERLIPIDEMICS*  muscle aches Niaspan *ANTIHYPERLIPIDEMICS*  body gets hot and feels like its burning  Family History Lolita Lenz; 2017-05-07 12:05 PM) Mother  Deceased. at age 35 from brain cancer, ;no known heart issues Father  Deceased. at age 83 from [place] Siblings  2, brother had an MI  Social History Lolita Lenz; May 07, 2017 12:05 PM) Current tobacco use  Former smoker. quit many years ago smoking Non Drinker/No Alcohol Use  Marital status  Married. Number of Children  2. Living Situation  Lives with spouse.  Past Surgical History Anderson Malta Sergeant; 2017-05-07 12:05 PM) tonsils  at age 45 torn cartilage in R knee [1982]: cervical disc [1991]: open heart quad bypass [1999]: rotator cuff in R shoulder [2008]:  Medication History Anderson Malta Sergeant; 05-07-17 12:16 PM) Metoprolol Succinate ER (25MG Tablet ER 24HR, 1/2 Oral daily, Taken starting 06/25/2013) Active. Isosorbide Mononitrate ER (30MG Tablet ER 24HR, 1 (one) Tablet ER 24HR Tablet Oral daily, Taken starting 02/08/2016) Active. Simvastatin (40MG Tablet, 1/2 Tablet Tablet Tablet Table Oral daily in the evening,  Taken starting 06/25/2013) Active. Quinapril HCl (20MG Tablet, 1 Tablet Tablet Tablet Tablet Oral daily, Taken starting 06/25/2013) Active. Nitrostat (0.4MG Tab Sublingual, 1 Tab Sublingual Tab Sublingua Sublingual every 69mn as needed, Taken starting 06/25/2013) Active. Pure Fish Oil (2 daily) Active. cardio plus 2080 (2 daily) Active. Immupex 4960 (2 daily) Active. Aspirin (81MG Tablet, 1 Oral daily) Active. Triamterene-HCTZ (37.5-25MG Tablet, 1 Oral three times weekly) Active. Medications Reconciled (verbally with patient)  Diagnostic Studies History (Anderson MaltaSergeant; 12018/04/1711:09 PM) Echocardiogram [04/30/2017]: Left ventricle cavity is minimally dilated at 5.3 cm and volume of 1373m Mild concentric hypertrophy of the left ventricle. Doppler evidence of grade I (impaired) diastolic dysfunction. Left ventricle regional wall motion findings: mid and apical inferior, inferolateral akinesis. LV systolic function is mild to moderately reduced. Visual EF is 40-45%. Left atrial cavity is severely dilated at 5.1 cm. Mild (Grade I) aortic regurgitation. Mild (Grade I) mitral regurgitation. Mild tricuspid regurgitation. No evidence of pulmonary hypertension. Nuclear stress test [04/25/2017]: 1. Prognostically, this is a moderate risk study. 2. Resting EKG shows NSR, frequent PVC. Stress EKG no change. V- Bigeminy and Quadragemini persisted. No ST-T changes. 3. The gated study showed the ejection fraction was 40-45%. There is lateral wall akinesis. There is a moderate-sized scar in the lateral wall extending from the mid ventricle towards the apex including the apical anterior wall. In addition there is very mild peri-infarct ischemia mostly towards mid anterolateral wall Holter Monitor [04/03/2017]:    Review of Systems (JLaverda PageD; 1010/17/20182:47 PM) General Present- Fatigue. Not Present- Fever and Night Sweats. Skin Not Present-  Itching and Rash. HEENT Not  Present- Headache. Respiratory Present- Snoring (Snoring and witnessed apneic episodes.). Not Present- Difficulty Breathing. Cardiovascular Not Present- Claudications, Fainting, Orthopnea and Swelling of Extremities. Gastrointestinal Not Present- Abdominal Pain, Constipation, Diarrhea, Nausea and Vomiting. Musculoskeletal Not Present- Joint Swelling. Neurological Not Present- Headaches. Hematology Not Present- Blood Clots, Easy Bruising and Nose Bleed. All other systems negative  Vitals Anderson Malta Sergeant; 05/05/2017 12:18 PM) 05/05/2017 12:10 PM Weight: 244.13 lb Height: 73in Body Surface Area: 2.34 m Body Mass Index: 32.21 kg/m  Pulse: 72 (Regular)  P.OX: 97% (Room air) BP: 139/90 (Sitting, Left Arm, Standard)       Physical Exam Dean Page, MD; 05/05/2017 9:52 PM) General Mental Status-Alert. General Appearance-Cooperative, Appears stated age, Not in acute distress. Orientation-Oriented X3. Build & Nutrition-Well built and Mildly obese.  Head and Neck Thyroid Gland Characteristics - no palpable nodules, no palpable enlargement.  Chest and Lung Exam Chest and lung exam reveals -quiet, even and easy respiratory effort with no use of accessory muscles.  Cardiovascular Cardiovascular examination reveals -normal heart sounds, regular rate and rhythm with no murmurs, carotid auscultation reveals no bruits, abdominal aorta auscultation reveals no bruits and no prominent pulsation, femoral artery auscultation bilaterally reveals normal pulses, no bruits, no thrills and normal pedal pulses bilaterally.  Abdomen Palpation/Percussion Normal exam - Non Tender and No hepatosplenomegaly. Auscultation Normal exam - Bowel sounds normal.  Peripheral Vascular Lower Extremity Inspection - Left - No Pigmentation, No Varicose veins. Right - No Pigmentation, No Varicose veins. Palpation - Edema - Bilateral - No edema. Abdomen-Prominent abdominal aortic  pulsation(non tender), No epigastric bruit.  Neurologic Motor-Grossly intact without any focal deficits.  Musculoskeletal Global Assessment Left Lower Extremity - normal range of motion without pain. Right Lower Extremity - normal range of motion without pain.    Assessment & Plan Dean Page MD; 05/05/2017 9:52 PM) NSVT (nonsustained ventricular tachycardia) (I47.2) Story: Holter Monitor 48 hours 04/03/2017: Symptomatic transmission of fatigue and weakness revealed normal sinus rhythm. There were frequent PVCs, brief episodes of ventricle bigeminy, there was a 9 beat run of ventricular tachycardia at a rate of 106 bpm noted at 12:30 AM. Total ventricular ectopy consisted of 38,000 beats in 48 hours with 300 PVCs in triplets, 5000 in couplets, 28,000 isolated PVC. Occasional PACs and atrial couplets were evident. No atrial fibrillation. Impression: Echocardiogram 04/30/2017: Left ventricle cavity is minimally dilated at 5.3 cm and volume of 164m. Mild concentric hypertrophy of the left ventricle. Doppler evidence of grade I (impaired) diastolic dysfunction. Left ventricle regional wall motion findings: mid and apical inferior, inferolateral akinesis. LV systolic function is mild to moderately reduced. Visual EF is 40-45%. Left atrial cavity is severely dilated at 5.1 cm. Mild (Grade I) aortic regurgitation. Mild (Grade I) mitral regurgitation. Mild tricuspid regurgitation. No evidence of pulmonary hypertension. 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 PVCs. Atherosclerosis of native coronary artery of native heart with angina pectoris (I25.119) Story: Coronary angiogram 10/25/2008 PTCA, stenting of proximal RCA with a 2.5 x 23 mm Xience DES. History of mid RCA Cypher stent and mid to distal Liberte stent placed remotely.  H/O CABG 1999: SVG to RCA occluded, LIMA to LAD patent, occluded SVG to OM   Lexiscan  myoview stress test 04/25/2017: 1. Prognostically, this is a moderate risk study. 2. Resting EKG shows NSR, frequent PVC. Stress EKG no change. V- Bigeminy and Quadragemini persisted. No ST-T changes. 3. The gated study showed the  ejection fraction was 40-45%. There is lateral wall akinesis. There is a moderate-sized scar in the lateral wall extending from the mid ventricle towards the apex including the apical anterior wall. In addition there is very mild peri-infarct ischemia mostly towards mid anterolateral wall. Compared to 07/02/2012, ischemia new and EF 56% Impression: EKG 02/06/2017: Normal sinus rhythm at rate of 64 bpm, normal axis. Poor progression, probably normal variant. Low voltage complexes. Pulmonary disease pattern. No significant change from EKG 02/08/2016. Future Plans 27/87/1836: METABOLIC PANEL, BASIC (72550) - one time 05/19/2017: CBC & PLATELETS (AUTO) (01642) - one time 05/19/2017: PT (PROTHROMBIN TIME) (90379) - one time Sleep apnea, obstructive (G47.33) Laboratory examination (Z01.89) Story: Labs 05/22/2017: Serum glucose 90 mg, BUN 12, creatinine 0.99, eGFR 76/88 a minute, potassium 4.8.  HB 16.4/HCT 46.0, platelets 145.  Pro time normal.  Labs 02/14/2017: Potassium 4.6, BUN 16, creatinine 0.92, eGFR greater than 60 mL. Serum bilirubin 1.6, otherwise CMP normal. HB 16.2/HCT 47.2, mild macrocytosis present. Nitrates 119. Total cholesterol 134, triglycerides 74, HDL 54, LDL 54, non-HDL cholesterol 80.  Note:-  Recommendations:  I'm concerned about episodes of nonsustained VT on Holter monitor. I will also concerned about frequent PVCs. I have already set him up for a sleep study, reviewed the results of the recently performed echocardiogram and nuclear stress test. Due to change in his LVEF, abnormal nuclear stress test, clinically he is high risk for sudden cardiac cardiac events. I have recommended that we proceed with coronary angiography. I have advised him to  increase his beta blocker back to his original dose. His blood pressure is mildly elevated today. Advised him that if he has recurrence of fatigue he could certainly contact the dose of beta blocker in half. I suspect that the low heart rate detected by pulse oximeter was probably an error at the PCPs office due to frequent PVCs. I'll make further ration of the angiography.  Schedule for cardiac catheterization, and possible angioplasty. We discussed regarding risks, benefits, alternatives to this including stress testing, CTA and continued medical therapy. Patient wants to proceed. Understands <1-2% risk of death, stroke, MI, urgent CABG, bleeding, infection, renal failure but not limited to these. Wife present.  CC: Guadalupe Maple, MD    Signed by Dean Page, MD (05/05/2017 9:53 PM)

## 2017-05-27 ENCOUNTER — Ambulatory Visit (HOSPITAL_COMMUNITY)
Admission: RE | Admit: 2017-05-27 | Discharge: 2017-05-27 | Disposition: A | Discharge status: Home / Self Care | Payer: Medicare Other | Source: Ambulatory Visit | Attending: Cardiology | Admitting: Cardiology

## 2017-05-27 ENCOUNTER — Encounter (HOSPITAL_COMMUNITY)
Admission: RE | Disposition: A | Discharge status: Home / Self Care | Payer: Self-pay | Source: Ambulatory Visit | Attending: Cardiology

## 2017-05-27 DIAGNOSIS — I340 Nonrheumatic mitral (valve) insufficiency: Secondary | ICD-10-CM | POA: Insufficient documentation

## 2017-05-27 DIAGNOSIS — G4733 Obstructive sleep apnea (adult) (pediatric): Secondary | ICD-10-CM | POA: Insufficient documentation

## 2017-05-27 DIAGNOSIS — I472 Ventricular tachycardia: Secondary | ICD-10-CM | POA: Insufficient documentation

## 2017-05-27 DIAGNOSIS — R002 Palpitations: Secondary | ICD-10-CM | POA: Insufficient documentation

## 2017-05-27 DIAGNOSIS — I10 Essential (primary) hypertension: Secondary | ICD-10-CM | POA: Insufficient documentation

## 2017-05-27 DIAGNOSIS — Z8249 Family history of ischemic heart disease and other diseases of the circulatory system: Secondary | ICD-10-CM | POA: Diagnosis not present

## 2017-05-27 DIAGNOSIS — D7589 Other specified diseases of blood and blood-forming organs: Secondary | ICD-10-CM | POA: Diagnosis not present

## 2017-05-27 DIAGNOSIS — Z79899 Other long term (current) drug therapy: Secondary | ICD-10-CM | POA: Insufficient documentation

## 2017-05-27 DIAGNOSIS — D696 Thrombocytopenia, unspecified: Secondary | ICD-10-CM | POA: Insufficient documentation

## 2017-05-27 DIAGNOSIS — R29898 Other symptoms and signs involving the musculoskeletal system: Secondary | ICD-10-CM | POA: Diagnosis not present

## 2017-05-27 DIAGNOSIS — R001 Bradycardia, unspecified: Secondary | ICD-10-CM | POA: Insufficient documentation

## 2017-05-27 DIAGNOSIS — I25119 Atherosclerotic heart disease of native coronary artery with unspecified angina pectoris: Secondary | ICD-10-CM | POA: Diagnosis not present

## 2017-05-27 DIAGNOSIS — Z808 Family history of malignant neoplasm of other organs or systems: Secondary | ICD-10-CM | POA: Diagnosis not present

## 2017-05-27 DIAGNOSIS — R5382 Chronic fatigue, unspecified: Secondary | ICD-10-CM | POA: Insufficient documentation

## 2017-05-27 DIAGNOSIS — Z87891 Personal history of nicotine dependence: Secondary | ICD-10-CM | POA: Diagnosis not present

## 2017-05-27 DIAGNOSIS — R5381 Other malaise: Secondary | ICD-10-CM | POA: Diagnosis not present

## 2017-05-27 DIAGNOSIS — E785 Hyperlipidemia, unspecified: Secondary | ICD-10-CM | POA: Diagnosis not present

## 2017-05-27 DIAGNOSIS — I25709 Atherosclerosis of coronary artery bypass graft(s), unspecified, with unspecified angina pectoris: Secondary | ICD-10-CM | POA: Diagnosis not present

## 2017-05-27 DIAGNOSIS — Z888 Allergy status to other drugs, medicaments and biological substances status: Secondary | ICD-10-CM | POA: Insufficient documentation

## 2017-05-27 DIAGNOSIS — Z955 Presence of coronary angioplasty implant and graft: Secondary | ICD-10-CM | POA: Insufficient documentation

## 2017-05-27 DIAGNOSIS — Z7982 Long term (current) use of aspirin: Secondary | ICD-10-CM | POA: Insufficient documentation

## 2017-05-27 DIAGNOSIS — I2510 Atherosclerotic heart disease of native coronary artery without angina pectoris: Secondary | ICD-10-CM | POA: Diagnosis present

## 2017-05-27 DIAGNOSIS — R6889 Other general symptoms and signs: Secondary | ICD-10-CM | POA: Diagnosis not present

## 2017-05-27 HISTORY — PX: LEFT HEART CATH AND CORS/GRAFTS ANGIOGRAPHY: CATH118250

## 2017-05-27 SURGERY — LEFT HEART CATH AND CORS/GRAFTS ANGIOGRAPHY
Anesthesia: LOCAL

## 2017-05-27 MED ORDER — SODIUM CHLORIDE 0.9% FLUSH: 3.0000 mL | INTRAVENOUS | Status: DC | PRN

## 2017-05-27 MED ORDER — SODIUM CHLORIDE 0.9 % IV SOLN: 250.0000 mL | INTRAVENOUS | Status: DC | PRN

## 2017-05-27 MED ORDER — IOPAMIDOL (ISOVUE-370) INJECTION 76%
INTRAVENOUS | Status: AC
  Filled 2017-05-27: qty 100

## 2017-05-27 MED ORDER — MIDAZOLAM HCL 2 MG/2ML IJ SOLN
INTRAMUSCULAR | Status: DC | PRN
  Administered 2017-05-27: 2 mg via INTRAVENOUS

## 2017-05-27 MED ORDER — HEPARIN (PORCINE) IN NACL 2-0.9 UNIT/ML-% IJ SOLN
INTRAMUSCULAR | Status: AC
  Filled 2017-05-27: qty 1000

## 2017-05-27 MED ORDER — SODIUM CHLORIDE 0.9% FLUSH: 3.0000 mL | Freq: Two times a day (BID) | INTRAVENOUS | Status: DC

## 2017-05-27 MED ORDER — IOPAMIDOL (ISOVUE-370) INJECTION 76%
INTRAVENOUS | Status: DC | PRN
  Administered 2017-05-27: 160 mL via INTRA_ARTERIAL

## 2017-05-27 MED ORDER — IOPAMIDOL (ISOVUE-370) INJECTION 76%
INTRAVENOUS | Status: AC
  Filled 2017-05-27: qty 50

## 2017-05-27 MED ORDER — SODIUM CHLORIDE 0.9 % WEIGHT BASED INFUSION: 1.0000 mL/kg/h | INTRAVENOUS | Status: DC

## 2017-05-27 MED ORDER — FENTANYL CITRATE (PF) 100 MCG/2ML IJ SOLN
INTRAMUSCULAR | Status: AC
  Filled 2017-05-27: qty 2

## 2017-05-27 MED ORDER — VERAPAMIL HCL 2.5 MG/ML IV SOLN
INTRAVENOUS | Status: DC | PRN
  Administered 2017-05-27: 10 mL via INTRA_ARTERIAL

## 2017-05-27 MED ORDER — LIDOCAINE HCL (PF) 1 % IJ SOLN
INTRAMUSCULAR | Status: AC
  Filled 2017-05-27: qty 30

## 2017-05-27 MED ORDER — SODIUM CHLORIDE 0.9 % WEIGHT BASED INFUSION
3.0000 mL/kg/h | INTRAVENOUS | Status: DC
  Administered 2017-05-27: 3 mL/kg/h via INTRAVENOUS

## 2017-05-27 MED ORDER — HEPARIN SODIUM (PORCINE) 1000 UNIT/ML IJ SOLN
INTRAMUSCULAR | Status: DC | PRN
  Administered 2017-05-27: 5000 [IU] via INTRAVENOUS

## 2017-05-27 MED ORDER — HEPARIN SODIUM (PORCINE) 1000 UNIT/ML IJ SOLN
INTRAMUSCULAR | Status: AC
  Filled 2017-05-27: qty 1

## 2017-05-27 MED ORDER — FENTANYL CITRATE (PF) 100 MCG/2ML IJ SOLN
INTRAMUSCULAR | Status: DC | PRN
  Administered 2017-05-27: 25 ug via INTRAVENOUS

## 2017-05-27 MED ORDER — HEPARIN (PORCINE) IN NACL 2-0.9 UNIT/ML-% IJ SOLN
INTRAMUSCULAR | Status: AC | PRN
  Administered 2017-05-27: 1000 mL

## 2017-05-27 MED ORDER — MIDAZOLAM HCL 2 MG/2ML IJ SOLN
INTRAMUSCULAR | Status: AC
  Filled 2017-05-27: qty 2

## 2017-05-27 MED ORDER — VERAPAMIL HCL 2.5 MG/ML IV SOLN
INTRAVENOUS | Status: AC
  Filled 2017-05-27: qty 2

## 2017-05-27 MED ORDER — ASPIRIN 81 MG PO CHEW: 81.0000 mg | CHEWABLE_TABLET | ORAL | Status: DC

## 2017-05-27 MED ORDER — LIDOCAINE HCL (PF) 1 % IJ SOLN
INTRAMUSCULAR | Status: DC | PRN
  Administered 2017-05-27: 2 mL via INTRADERMAL

## 2017-05-27 SURGICAL SUPPLY — 15 items
CATH INFINITI 5 FR AL2 (CATHETERS) ×1 IMPLANT
CATH INFINITI 5 FR IM (CATHETERS) ×1 IMPLANT
CATH INFINITI 5FR AL1 (CATHETERS) ×1 IMPLANT
CATH INFINITI 5FR ANG PIGTAIL (CATHETERS) ×1 IMPLANT
CATH INFINITI JR4 5F (CATHETERS) ×1 IMPLANT
CATH OPTITORQUE TIG 4.0 5F (CATHETERS) ×1 IMPLANT
DEVICE RAD COMP TR BAND LRG (VASCULAR PRODUCTS) ×1 IMPLANT
GLIDESHEATH SLEND A-KIT 6F 20G (SHEATH) ×1 IMPLANT
GUIDEWIRE INQWIRE 1.5J.035X260 (WIRE) IMPLANT
INQWIRE 1.5J .035X260CM (WIRE) ×2
KIT HEART LEFT (KITS) ×2 IMPLANT
PACK CARDIAC CATHETERIZATION (CUSTOM PROCEDURE TRAY) ×2 IMPLANT
SYR MEDRAD MARK V 150ML (SYRINGE) ×1 IMPLANT
TRANSDUCER W/STOPCOCK (MISCELLANEOUS) ×2 IMPLANT
TUBING CIL FLEX 10 FLL-RA (TUBING) ×2 IMPLANT

## 2017-05-27 NOTE — Interval H&P Note (Signed)
History and Physical Interval Note:  05/27/2017 3:13 PM  Dean Wilkins  has presented today for surgery, with the diagnosis of chest pain   The various methods of treatment have been discussed with the patient and family. After consideration of risks, benefits and other options for treatment, the patient has consented to  Procedure(s): LEFT HEART CATH AND CORS/GRAFTS ANGIOGRAPHY (N/A) and possible PCI as a surgical intervention .  The patient's history has been reviewed, patient examined, no change in status, stable for surgery.  I have reviewed the patient's chart and labs.  Questions were answered to the patient's satisfaction.   Symptom Status: Ischemic Symptoms Non-invasive Testing: Intermediate Risk If no or indeterminate stress test, FFR/iFR results in all diseased vessels: N/A Diabetes Mellitus: No S/P CABG: Yes Antianginal therapy (number of long-acting drugs): >=2 Patient undergoing renal transplant: No Patient undergoing percutaneous valve procedure: No  LIMA-LAD patent and without significant stenoses Stenosis supplying 1 territory (bypass graft or native artery) other than anterior  PCI: A (8);  Indication 31  CABG: M (5);  Indication 31 Stenoses supplying 2 territories (bypass graft or native artery, either 2 separate vessels or sequential graft supplying 2 territories) not including anterior territory  PCI: A (8);  Indication 34  CABG: M (6);  Indication 34  LIMA-LAD not patent Stenosis supplying 1 territory (bypass graft or native artery)-anterior (LAD) territory  PCI: A (8);  Indication 36  CABG: M (6);  Indication 36 Stenoses supplying 2 territories (bypass graft or native artery, either 2 separate vessels or sequential graft supplying 2 territories)-LAD plus other territory  PCI: A (8);  Indication 39  CABG: A (8);  Indication 39 Stenoses supplying 3 territories (bypass graft or native arteries, separate vessels, sequential grafts, or combination thereof)-LAD plus 2  other territories  PCI: A (8);  Indication 41  CABG: A (8);  Indication 41  Notes:  A indicates appropriate. M indicates may be appropriate. R indicates rarely appropriate. Number in parentheses is median score for that indication. Reclassify indicates number of functionally diseased vessels should be decreased given negative FFR/iFR. Re-evaluate the scenario interpreting any FFR/iFR negative vessel as being not significantly stenosed.  Journal of the [employer] of Cardiology Mar 2017, 23391; DOI: 10.1016/j.jacc.2017.02.001 [email or web address].de.[email or web address].full-text.pdf This App  2018 by the Society for Cardiovascular Angiography and Interventions   Adrian Prows

## 2017-05-27 NOTE — Progress Notes (Signed)
Dr Einar Gip in and small amt swelling noted proximal to TR band and pressure held x 5 min and no swelling noted now

## 2017-05-27 NOTE — Discharge Instructions (Signed)

## 2017-05-28 ENCOUNTER — Encounter (HOSPITAL_COMMUNITY): Payer: Self-pay | Admitting: Cardiology

## 2017-06-03 ENCOUNTER — Institutional Professional Consult (permissible substitution): Payer: Self-pay | Admitting: Neurology

## 2017-06-04 DIAGNOSIS — G4733 Obstructive sleep apnea (adult) (pediatric): Secondary | ICD-10-CM | POA: Diagnosis not present

## 2017-06-05 DIAGNOSIS — I25119 Atherosclerotic heart disease of native coronary artery with unspecified angina pectoris: Secondary | ICD-10-CM | POA: Diagnosis not present

## 2017-06-05 DIAGNOSIS — I493 Ventricular premature depolarization: Secondary | ICD-10-CM | POA: Diagnosis not present

## 2017-06-05 DIAGNOSIS — G4733 Obstructive sleep apnea (adult) (pediatric): Secondary | ICD-10-CM | POA: Diagnosis not present

## 2017-06-05 DIAGNOSIS — I472 Ventricular tachycardia: Secondary | ICD-10-CM | POA: Diagnosis not present

## 2017-08-01 DIAGNOSIS — D649 Anemia, unspecified: Secondary | ICD-10-CM | POA: Diagnosis not present

## 2017-08-01 DIAGNOSIS — I10 Essential (primary) hypertension: Secondary | ICD-10-CM | POA: Diagnosis not present

## 2017-08-01 DIAGNOSIS — R001 Bradycardia, unspecified: Secondary | ICD-10-CM | POA: Diagnosis not present

## 2017-08-01 DIAGNOSIS — E785 Hyperlipidemia, unspecified: Secondary | ICD-10-CM | POA: Diagnosis not present

## 2017-08-01 DIAGNOSIS — I2510 Atherosclerotic heart disease of native coronary artery without angina pectoris: Secondary | ICD-10-CM | POA: Diagnosis not present

## 2017-08-01 DIAGNOSIS — D696 Thrombocytopenia, unspecified: Secondary | ICD-10-CM | POA: Diagnosis not present

## 2017-09-05 DIAGNOSIS — I472 Ventricular tachycardia: Secondary | ICD-10-CM | POA: Diagnosis not present

## 2017-09-05 DIAGNOSIS — I25119 Atherosclerotic heart disease of native coronary artery with unspecified angina pectoris: Secondary | ICD-10-CM | POA: Diagnosis not present

## 2017-09-05 DIAGNOSIS — I493 Ventricular premature depolarization: Secondary | ICD-10-CM | POA: Diagnosis not present

## 2017-09-05 DIAGNOSIS — G4733 Obstructive sleep apnea (adult) (pediatric): Secondary | ICD-10-CM | POA: Diagnosis not present

## 2017-09-18 DIAGNOSIS — H25813 Combined forms of age-related cataract, bilateral: Secondary | ICD-10-CM | POA: Diagnosis not present

## 2017-09-18 DIAGNOSIS — I10 Essential (primary) hypertension: Secondary | ICD-10-CM | POA: Diagnosis not present

## 2017-09-30 DIAGNOSIS — G4733 Obstructive sleep apnea (adult) (pediatric): Secondary | ICD-10-CM | POA: Diagnosis not present

## 2017-10-08 DIAGNOSIS — G4733 Obstructive sleep apnea (adult) (pediatric): Secondary | ICD-10-CM | POA: Diagnosis not present

## 2017-10-28 DIAGNOSIS — G4733 Obstructive sleep apnea (adult) (pediatric): Secondary | ICD-10-CM | POA: Diagnosis not present

## 2017-11-04 DIAGNOSIS — G4733 Obstructive sleep apnea (adult) (pediatric): Secondary | ICD-10-CM | POA: Diagnosis not present

## 2017-12-09 DIAGNOSIS — G4733 Obstructive sleep apnea (adult) (pediatric): Secondary | ICD-10-CM | POA: Diagnosis not present

## 2018-01-20 DIAGNOSIS — G4733 Obstructive sleep apnea (adult) (pediatric): Secondary | ICD-10-CM | POA: Diagnosis not present

## 2018-01-30 DIAGNOSIS — I2510 Atherosclerotic heart disease of native coronary artery without angina pectoris: Secondary | ICD-10-CM | POA: Diagnosis not present

## 2018-01-30 DIAGNOSIS — Z0000 Encounter for general adult medical examination without abnormal findings: Secondary | ICD-10-CM | POA: Diagnosis not present

## 2018-01-30 DIAGNOSIS — Z724 Inappropriate diet and eating habits: Secondary | ICD-10-CM | POA: Diagnosis not present

## 2018-01-30 DIAGNOSIS — R799 Abnormal finding of blood chemistry, unspecified: Secondary | ICD-10-CM | POA: Diagnosis not present

## 2018-01-30 DIAGNOSIS — I10 Essential (primary) hypertension: Secondary | ICD-10-CM | POA: Diagnosis not present

## 2018-01-30 DIAGNOSIS — E785 Hyperlipidemia, unspecified: Secondary | ICD-10-CM | POA: Diagnosis not present

## 2018-03-06 DIAGNOSIS — G4733 Obstructive sleep apnea (adult) (pediatric): Secondary | ICD-10-CM | POA: Diagnosis not present

## 2018-03-06 DIAGNOSIS — I25119 Atherosclerotic heart disease of native coronary artery with unspecified angina pectoris: Secondary | ICD-10-CM | POA: Diagnosis not present

## 2018-03-06 DIAGNOSIS — I10 Essential (primary) hypertension: Secondary | ICD-10-CM | POA: Diagnosis not present

## 2018-03-06 DIAGNOSIS — I493 Ventricular premature depolarization: Secondary | ICD-10-CM | POA: Diagnosis not present

## 2018-03-24 DIAGNOSIS — H5203 Hypermetropia, bilateral: Secondary | ICD-10-CM | POA: Diagnosis not present

## 2018-03-24 DIAGNOSIS — H52223 Regular astigmatism, bilateral: Secondary | ICD-10-CM | POA: Diagnosis not present

## 2018-03-24 DIAGNOSIS — H524 Presbyopia: Secondary | ICD-10-CM | POA: Diagnosis not present

## 2018-03-24 DIAGNOSIS — H25813 Combined forms of age-related cataract, bilateral: Secondary | ICD-10-CM | POA: Diagnosis not present

## 2018-03-24 DIAGNOSIS — I10 Essential (primary) hypertension: Secondary | ICD-10-CM | POA: Diagnosis not present

## 2018-04-27 DIAGNOSIS — G4733 Obstructive sleep apnea (adult) (pediatric): Secondary | ICD-10-CM | POA: Diagnosis not present

## 2018-04-28 DIAGNOSIS — G4733 Obstructive sleep apnea (adult) (pediatric): Secondary | ICD-10-CM | POA: Diagnosis not present

## 2018-06-04 DIAGNOSIS — H02831 Dermatochalasis of right upper eyelid: Secondary | ICD-10-CM | POA: Diagnosis not present

## 2018-06-04 DIAGNOSIS — H2512 Age-related nuclear cataract, left eye: Secondary | ICD-10-CM | POA: Diagnosis not present

## 2018-06-04 DIAGNOSIS — H2513 Age-related nuclear cataract, bilateral: Secondary | ICD-10-CM | POA: Diagnosis not present

## 2018-06-04 DIAGNOSIS — H25043 Posterior subcapsular polar age-related cataract, bilateral: Secondary | ICD-10-CM | POA: Diagnosis not present

## 2018-06-04 DIAGNOSIS — H18413 Arcus senilis, bilateral: Secondary | ICD-10-CM | POA: Diagnosis not present

## 2018-06-04 DIAGNOSIS — H25013 Cortical age-related cataract, bilateral: Secondary | ICD-10-CM | POA: Diagnosis not present

## 2018-07-29 DIAGNOSIS — H2512 Age-related nuclear cataract, left eye: Secondary | ICD-10-CM | POA: Diagnosis not present

## 2018-07-29 DIAGNOSIS — H25812 Combined forms of age-related cataract, left eye: Secondary | ICD-10-CM | POA: Diagnosis not present

## 2018-07-29 DIAGNOSIS — Z9842 Cataract extraction status, left eye: Secondary | ICD-10-CM | POA: Diagnosis not present

## 2018-07-29 DIAGNOSIS — Z961 Presence of intraocular lens: Secondary | ICD-10-CM | POA: Diagnosis not present

## 2018-07-30 DIAGNOSIS — H2511 Age-related nuclear cataract, right eye: Secondary | ICD-10-CM | POA: Diagnosis not present

## 2018-08-04 DIAGNOSIS — G4733 Obstructive sleep apnea (adult) (pediatric): Secondary | ICD-10-CM | POA: Diagnosis not present

## 2018-08-12 DIAGNOSIS — H2511 Age-related nuclear cataract, right eye: Secondary | ICD-10-CM | POA: Diagnosis not present

## 2018-08-12 DIAGNOSIS — Z961 Presence of intraocular lens: Secondary | ICD-10-CM | POA: Diagnosis not present

## 2018-08-12 DIAGNOSIS — H25811 Combined forms of age-related cataract, right eye: Secondary | ICD-10-CM | POA: Diagnosis not present

## 2018-08-12 DIAGNOSIS — Z9841 Cataract extraction status, right eye: Secondary | ICD-10-CM | POA: Diagnosis not present

## 2018-12-24 DIAGNOSIS — D224 Melanocytic nevi of scalp and neck: Secondary | ICD-10-CM | POA: Diagnosis not present

## 2018-12-24 DIAGNOSIS — D485 Neoplasm of uncertain behavior of skin: Secondary | ICD-10-CM | POA: Diagnosis not present

## 2018-12-24 DIAGNOSIS — L820 Inflamed seborrheic keratosis: Secondary | ICD-10-CM | POA: Diagnosis not present

## 2018-12-24 DIAGNOSIS — L814 Other melanin hyperpigmentation: Secondary | ICD-10-CM | POA: Diagnosis not present

## 2019-01-29 DIAGNOSIS — E785 Hyperlipidemia, unspecified: Secondary | ICD-10-CM | POA: Diagnosis not present

## 2019-01-29 DIAGNOSIS — I10 Essential (primary) hypertension: Secondary | ICD-10-CM | POA: Diagnosis not present

## 2019-01-29 DIAGNOSIS — R799 Abnormal finding of blood chemistry, unspecified: Secondary | ICD-10-CM | POA: Diagnosis not present

## 2019-01-29 DIAGNOSIS — D6949 Other primary thrombocytopenia: Secondary | ICD-10-CM | POA: Diagnosis not present

## 2019-01-29 DIAGNOSIS — I2510 Atherosclerotic heart disease of native coronary artery without angina pectoris: Secondary | ICD-10-CM | POA: Diagnosis not present

## 2019-03-02 ENCOUNTER — Encounter: Payer: Self-pay | Admitting: Cardiology

## 2019-03-02 ENCOUNTER — Other Ambulatory Visit: Payer: Self-pay

## 2019-03-02 ENCOUNTER — Ambulatory Visit (INDEPENDENT_AMBULATORY_CARE_PROVIDER_SITE_OTHER): Payer: Medicare Other | Admitting: Cardiology

## 2019-03-02 VITALS — BP 126/86 | HR 64 | Ht 73.0 in | Wt 238.1 lb

## 2019-03-02 DIAGNOSIS — I10 Essential (primary) hypertension: Secondary | ICD-10-CM | POA: Diagnosis not present

## 2019-03-02 DIAGNOSIS — Z9989 Dependence on other enabling machines and devices: Secondary | ICD-10-CM

## 2019-03-02 DIAGNOSIS — G4733 Obstructive sleep apnea (adult) (pediatric): Secondary | ICD-10-CM | POA: Diagnosis not present

## 2019-03-02 DIAGNOSIS — I472 Ventricular tachycardia: Secondary | ICD-10-CM

## 2019-03-02 DIAGNOSIS — I25119 Atherosclerotic heart disease of native coronary artery with unspecified angina pectoris: Secondary | ICD-10-CM | POA: Insufficient documentation

## 2019-03-02 DIAGNOSIS — Z9861 Coronary angioplasty status: Secondary | ICD-10-CM | POA: Insufficient documentation

## 2019-03-02 DIAGNOSIS — I2510 Atherosclerotic heart disease of native coronary artery without angina pectoris: Secondary | ICD-10-CM

## 2019-03-02 DIAGNOSIS — Z951 Presence of aortocoronary bypass graft: Secondary | ICD-10-CM | POA: Diagnosis not present

## 2019-03-02 DIAGNOSIS — E7800 Pure hypercholesterolemia, unspecified: Secondary | ICD-10-CM

## 2019-03-02 DIAGNOSIS — E785 Hyperlipidemia, unspecified: Secondary | ICD-10-CM

## 2019-03-02 DIAGNOSIS — I4729 Other ventricular tachycardia: Secondary | ICD-10-CM

## 2019-03-02 DIAGNOSIS — I493 Ventricular premature depolarization: Secondary | ICD-10-CM | POA: Insufficient documentation

## 2019-03-02 HISTORY — DX: Essential (primary) hypertension: I10

## 2019-03-02 HISTORY — DX: Ventricular premature depolarization: I49.3

## 2019-03-02 HISTORY — DX: Other ventricular tachycardia: I47.29

## 2019-03-02 HISTORY — DX: Hyperlipidemia, unspecified: E78.5

## 2019-03-02 HISTORY — DX: Presence of aortocoronary bypass graft: Z95.1

## 2019-03-02 HISTORY — DX: Atherosclerotic heart disease of native coronary artery with unspecified angina pectoris: I25.119

## 2019-03-02 HISTORY — DX: Coronary angioplasty status: Z98.61

## 2019-03-02 HISTORY — DX: Ventricular tachycardia: I47.2

## 2019-03-02 NOTE — Progress Notes (Signed)
Primary Physician:  Guadalupe Maple, MD   Patient ID: Dean Wilkins, male    DOB: August 12, 1945, 74 y.o.   MRN: [id number]  Subjective:    Chief Complaint  Patient presents with   Coronary Artery Disease   Follow-up    HPI: Dean Wilkins  is a 73 y.o. male  with hypertension, hyperlipidemia, coronary artery disease status post CABG 1999 with patent LIMA-lAD on cath in 05/2017, multiple PCI to RCA, severe OM1 stenosis, mildly reduced LVEF 45%, OSA now on CPAP since Apr 2019, medically treated SVT, here for 1 year follow up.  Essentially asymptomatic. He is still working full time as a Development worker, community.  Continues to be compliant with CPAP, has noticed improved energy with this.  Denies any chest pain or shortness of breath.  States he is recently had labs performed with his PCP 1 month ago and states he was told no abnormalities.   Past Medical History:  Diagnosis Date   Aortic sclerosis    Atherosclerosis of native coronary artery of native heart with angina pectoris ([place]) 03/02/2019   Coronary artery disease    Exogenous obesity    Heart murmur    History of coronary artery bypass graft x 3 03/02/2019   History of PTCA 03/02/2019   HLD (hyperlipidemia) 03/02/2019   HTN (hypertension) 03/02/2019   Hyperlipidemia    Hypertension    Left ventricular hypertrophy    NSVT (nonsustained ventricular tachycardia) ([place]) 03/02/2019   PVC (premature ventricular contraction) 03/02/2019    Past Surgical History:  Procedure Laterality Date   CORONARY ARTERY BYPASS GRAFT  1999   DES stent to mid native RCA /DES stent to mild RCA  2008/2009   LEFT HEART CATH AND CORS/GRAFTS ANGIOGRAPHY N/A 05/27/2017   Procedure: LEFT HEART CATH AND CORS/GRAFTS ANGIOGRAPHY;  Surgeon: Adrian Prows, MD;  Location: [facility];  Service: Cardiovascular;  Laterality: N/A;   NM [facility]     2013    Social History   Socioeconomic History   Marital status: Married    Spouse name: Not on file     Number of children: 2   Years of education: Not on file   Highest education level: Not on file  Occupational History   Not on file  Social Needs   Financial resource strain: Not on file   Food insecurity    Worry: Not on file    Inability: Not on file   Transportation needs    Medical: Not on file    Non-medical: Not on file  Tobacco Use   Smoking status: Former Smoker   Smokeless tobacco: Never Used   Tobacco comment: only smoked for a year  Substance and Sexual Activity   Alcohol use: Not Currently   Drug use: Never   Sexual activity: Not on file  Lifestyle   Physical activity    Days per week: Not on file    Minutes per session: Not on file   Stress: Not on file  Relationships   Social connections    Talks on phone: Not on file    Gets together: Not on file    Attends religious service: Not on file    Active member of club or organization: Not on file    Attends meetings of clubs or organizations: Not on file    Relationship status: Not on file   Intimate partner violence    Fear of current or ex partner: Not on file    Emotionally abused: Not  on file    Physically abused: Not on file    Forced sexual activity: Not on file  Other Topics Concern   Not on file  Social History Narrative   Not on file    Review of Systems  Constitution: Negative for decreased appetite, malaise/fatigue, weight gain and weight loss.  Eyes: Negative for visual disturbance.  Cardiovascular: Negative for chest pain, claudication, dyspnea on exertion, leg swelling, orthopnea, palpitations and syncope.  Respiratory: Positive for snoring. Negative for hemoptysis and wheezing.   Endocrine: Negative for cold intolerance and heat intolerance.  Hematologic/Lymphatic: Does not bruise/bleed easily.  Skin: Negative for nail changes.  Musculoskeletal: Positive for back pain. Negative for muscle weakness and myalgias.  Gastrointestinal: Negative for abdominal pain, change in  bowel habit, nausea and vomiting.  Neurological: Negative for difficulty with concentration, dizziness, focal weakness and headaches.  Psychiatric/Behavioral: Negative for altered mental status and suicidal ideas.  All other systems reviewed and are negative.     Objective:  Blood pressure 126/86, pulse 64, height '6\' 1"'  (1.854 m), weight 238 lb 1.6 oz (108 kg), SpO2 99 %. Body mass index is 31.41 kg/m.    Physical Exam  Constitutional: He is oriented to person, place, and time. Vital signs are normal. He appears well-developed and well-nourished.  HENT:  Head: Normocephalic and atraumatic.  Neck: Normal range of motion.  Cardiovascular: Normal rate, regular rhythm, normal heart sounds and intact distal pulses.  Pulmonary/Chest: Effort normal and breath sounds normal. No accessory muscle usage. No respiratory distress.  Abdominal: Soft. Bowel sounds are normal.  Musculoskeletal: Normal range of motion.  Neurological: He is alert and oriented to person, place, and time.  Skin: Skin is warm and dry.  Vitals reviewed.  Radiology: No results found.  Laboratory examination:   08/01/2016: Normal H&H, MCV 94.8, MCH 32.5, platelets 134, CBC otherwise normal. Labs 05/22/2017: Serum glucose 90 mg, BUN 12, creatinine 0.99, eGFR 76/88 a minute, potassium 4.8. HB 16.4/HCT 46.0, platelets 145. Pro time normal.  CMP Latest Ref Rng & Units 10/26/2008 10/25/2008 10/24/2008  Glucose 70 - 99 mg/dL 87 91 84  BUN 6 - 23 mg/dL '7 11 15  ' Creatinine 0.4 - 1.5 mg/dL 0.95 0.97 0.98  Sodium 135 - 145 mEq/L 138 137 136  Potassium 3.5 - 5.1 mEq/L 3.7 3.6 4.1  Chloride 96 - 112 mEq/L 109 108 104  CO2 19 - 32 mEq/L '25 26 23  ' Calcium 8.4 - 10.5 mg/dL 8.8 9.0 9.2  Total Protein 6.0 - 8.3 g/dL - - 6.2  Total Bilirubin 0.3 - 1.2 mg/dL - - 0.8  Alkaline Phos 39 - 117 U/L - - 44  AST 0 - 37 U/L - - 25  ALT 0 - 53 U/L - - 21   CBC Latest Ref Rng & Units 10/26/2008 10/25/2008 10/24/2008  WBC 4.0 - 10.5 K/uL 5.2 5.1 6.2    Hemoglobin 13.0 - 17.0 g/dL 15.1 14.9 16.0  Hematocrit 39.0 - 52.0 % 43.7 42.7 46.0  Platelets 150 - 400 K/uL 103(L) 114(L) 136(L)   Lipid Panel     Component Value Date/Time   CHOL  10/25/2008 0430    114        ATP III CLASSIFICATION:  <200     mg/dL   Desirable  200-239  mg/dL   Borderline High  >=240    mg/dL   High          TRIG 46 10/25/2008 0430   HDL 35 (L) 10/25/2008 0430  CHOLHDL 3.3 10/25/2008 0430   VLDL 9 10/25/2008 0430   LDLCALC  10/25/2008 0430    70        Total Cholesterol/HDL:CHD Risk Coronary Heart Disease Risk Table                     Men   Women  1/2 Average Risk   3.4   3.3  Average Risk       5.0   4.4  2 X Average Risk   9.6   7.1  3 X Average Risk  23.4   11.0        Use the calculated Patient Ratio above and the CHD Risk Table to determine the patient's CHD Risk.        ATP III CLASSIFICATION (LDL):  <100     mg/dL   Optimal  100-129  mg/dL   Near or Above                    Optimal  130-159  mg/dL   Borderline  160-189  mg/dL   High  >190     mg/dL   Very High   HEMOGLOBIN A1C Lab Results  Component Value Date   HGBA1C  10/24/2008    4.8 (NOTE)   The ADA recommends the following therapeutic goal for glycemic   control related to Hgb A1C measurement:   Goal of Therapy:   < 7.0% Hgb A1C   Reference: American Diabetes Association: Clinical Practice   Recommendations 2008, Diabetes Care,  2008, 31:(Suppl 1).   MPG 91 10/24/2008   TSH No results for input(s): TSH in the last 8760 hours.  PRN Meds:. There are no discontinued medications. Current Meds  Medication Sig   aspirin EC 81 MG tablet Take 81 mg by mouth daily.   isosorbide mononitrate (IMDUR) 30 MG 24 hr tablet Take 30 mg by mouth every evening.    metoprolol succinate (TOPROL-XL) 25 MG 24 hr tablet Take 25 mg by mouth daily.   Multiple Vitamins-Minerals (CENTURY CARDIO HEALTH FORMULA PO) Take 2 capsules by mouth daily. Cardio-Plus Nutritional Supplement   Multiple  Vitamins-Minerals (IMMUNE SUPPORT PO) Take 2 capsules by mouth daily. Immuplex Nutritional Supplement.   nitroGLYCERIN (NITROSTAT) 0.4 MG SL tablet Place 0.4 mg under the tongue every 5 (five) minutes as needed for chest pain.   Omega-3 Fatty Acids (FISH OIL) 1000 MG CAPS Take 2,000 mg by mouth daily.    quinapril (ACCUPRIL) 20 MG tablet Take 20 mg by mouth at bedtime.   simvastatin (ZOCOR) 40 MG tablet Take 20 mg by mouth every evening. 1/2 tab   triamterene-hydrochlorothiazide (MAXZIDE-25) 37.5-25 MG tablet Take 1 tablet by mouth. 1 tablet 4 days a week    Cardiac Studies:   Coronary angiogram 05/27/2017: Stents placed 10/25/2008 in proximal RCA with a 2.5 x 23 mm Xience DES 30-40% ISR, History of mid RCA Cypher stent and mid to distal Liberte stent placed remotely patent. Prox Cx 40% diffuse, OM-1 95%, LAD flush occluded with patent LIMA to LAD. CABG 1999. Occluded SVG to OM and RIMA to RCA old.  Echocardiogram [04/30/2017]: Left ventricle cavity is minimally dilated at 5.3 cm and volume of 180m. Mild concentric hypertrophy of the left ventricle. Doppler evidence of grade I (impaired) diastolic dysfunction. Left ventricle regional wall motion findings: mid and apical inferior, inferolateral akinesis. LV systolic function is mild to moderately reduced. Visual EF is 40-45%. Left atrial cavity is severely dilated at 5.1  cm. Mild (Grade I) aortic regurgitation. Mild (Grade I) mitral regurgitation. Mild tricuspid regurgitation. No evidence of pulmonary hypertension.  Nuclear stress test [04/25/2017]:  1. Prognostically, this is a moderate risk study. 2. Resting EKG shows NSR, frequent PVC. Stress EKG no change. V- Bigeminy and Quadragemini persisted. No ST-T changes. 3. The gated study showed the ejection fraction was 40-45%. There is lateral wall akinesis. There is a moderate-sized scar in the lateral wall extending from the mid ventricle towards the apex including the apical anterior wall.  In addition there is very mild peri-infarct ischemia mostly towards mid anterolateral wall  Abdominal Ultrasound [03/21/2015]: No AAA observed. Mild atheresclerosis.  Assessment:     ICD-10-CM   1. Atherosclerosis of native coronary artery of native heart without angina pectoris  I25.10   2. History of coronary artery bypass graft x 3  Z95.1 EKG 12-Lead   1999  3. Essential hypertension  I10   4. Obstructive sleep apnea on CPAP  G47.33    Z99.89   5. Pure hypercholesterolemia  E78.00     EKG 03/02/2019: Normal sinus rhythm at 61 bpm with first degree AV block, normal axis, Poor R wave progression, cannot exclude anterior infarct old. No evidence of ischemia. Low voltage complexes  Recommendations:   Dean Wilkins is a Caucasian male with hypertension, hyperlipidemia, coronary artery disease status post CABG 1999 with patent LIMA-lAD on cath in 05/2017, multiple PCI to RCA, severe OM1 stenosis, mildly reduced LVEF 45%, OSA now on CPAP since Apr 2019, medically treated SVT, here for 1 year follow up.  Patient is presently doing well without any complaints today.  Denies any chest pain or shortness of breath.  Has minimal leg edema.  He is tolerating medications well.  Blood pressure is well controlled.  He states that he has recently had labs performed last month.  Will request for our records.  He has not had recurrence of PVCs since being on metoprolol.  Denies any palpitations.  We will continue with metoprolol.  He continues to be compliant with CPAP and has had improved energy with this.  Overall, patient is stable from cardiac standpoint.  I will see him back in 1 year or sooner if needed.  Miquel Dunn, MSN, APRN, FNP-C [facility]. [place] Office: [phone] Fax: [phone]

## 2019-03-05 ENCOUNTER — Ambulatory Visit: Payer: Self-pay | Admitting: Cardiology

## 2019-03-23 DIAGNOSIS — G4733 Obstructive sleep apnea (adult) (pediatric): Secondary | ICD-10-CM | POA: Diagnosis not present

## 2019-04-22 DIAGNOSIS — Z23 Encounter for immunization: Secondary | ICD-10-CM | POA: Diagnosis not present

## 2019-08-13 DIAGNOSIS — M1612 Unilateral primary osteoarthritis, left hip: Secondary | ICD-10-CM | POA: Diagnosis not present

## 2019-08-13 DIAGNOSIS — M25552 Pain in left hip: Secondary | ICD-10-CM | POA: Diagnosis not present

## 2019-08-17 ENCOUNTER — Encounter: Payer: Self-pay | Admitting: Cardiology

## 2019-08-24 DIAGNOSIS — E785 Hyperlipidemia, unspecified: Secondary | ICD-10-CM | POA: Diagnosis not present

## 2019-08-24 DIAGNOSIS — M25552 Pain in left hip: Secondary | ICD-10-CM | POA: Diagnosis not present

## 2019-08-24 DIAGNOSIS — I2510 Atherosclerotic heart disease of native coronary artery without angina pectoris: Secondary | ICD-10-CM | POA: Diagnosis not present

## 2019-08-24 DIAGNOSIS — I10 Essential (primary) hypertension: Secondary | ICD-10-CM | POA: Diagnosis not present

## 2019-08-24 DIAGNOSIS — R799 Abnormal finding of blood chemistry, unspecified: Secondary | ICD-10-CM | POA: Diagnosis not present

## 2019-08-24 DIAGNOSIS — Z01818 Encounter for other preprocedural examination: Secondary | ICD-10-CM | POA: Diagnosis not present

## 2019-09-07 ENCOUNTER — Ambulatory Visit: Payer: Self-pay | Admitting: Orthopedic Surgery

## 2019-09-08 ENCOUNTER — Other Ambulatory Visit (HOSPITAL_COMMUNITY): Payer: Self-pay | Admitting: *Deleted

## 2019-09-08 NOTE — Patient Instructions (Addendum)
DUE TO COVID-19 ONLY ONE VISITOR IS ALLOWED TO COME WITH YOU AND STAY IN THE WAITING ROOM ONLY DURING PRE OP AND PROCEDURE DAY OF SURGERY. THE 1 VISITOR MAY VISIT WITH YOU AFTER SURGERY IN YOUR PRIVATE ROOM DURING VISITING HOURS ONLY!   ONCE YOUR COVID TEST IS COMPLETED, PLEASE BEGIN THE QUARANTINE INSTRUCTIONS AS OUTLINED IN YOUR HANDOUT.                Dean Wilkins     Your procedure is scheduled on: Thursday 09/16/2019   Report to [facility] Main  Entrance    Report to Short Stay at   [place] AM     Call this number if you have problems the morning of surgery [phone]    Remember: Do not eat food  :After Midnight.     NO SOLID FOOD AFTER MIDNIGHT THE NIGHT PRIOR TO SURGERY  And   NOTHING BY MOUTH EXCEPT CLEAR LIQUIDS UNTIL  0430 am .     PLEASE FINISH ENSURE DRINK PER SURGEON ORDER  WHICH NEEDS TO BE COMPLETED AT 0430 am .   CLEAR LIQUID DIET   Foods Allowed                                                                     Foods Excluded  Coffee and tea, regular and decaf                             liquids that you cannot  Plain Jell-O any favor except red or purple                                           see through such as: Fruit ices (not with fruit pulp)                                     milk, soups, orange juice  Iced Popsicles                                    All solid food Carbonated beverages, regular and diet                                    Cranberry, grape and apple juices Sports drinks like Gatorade Lightly seasoned clear broth or consume(fat free) Sugar, honey syrup  Sample Menu Breakfast                                Lunch                                     Supper Cranberry juice  Beef broth                            Chicken broth Jell-O                                     Grape juice                           Apple juice Coffee or tea                        Jell-O                                       Popsicle                                                Coffee or tea                        Coffee or tea  _____________________________________________________________________     BRUSH YOUR TEETH MORNING OF SURGERY AND RINSE YOUR MOUTH OUT, NO CHEWING GUM CANDY OR MINTS.     Take these medicines the morning of surgery with A SIP OF WATER: Metoprolol succinate (Toprol-XL)                                 You may not have any metal on your body including hair pins and              piercings  Do not wear jewelry, make-up, lotions, powders or perfumes, deodorant                         Men may shave face and neck.   Do not bring valuables to the hospital. [place].  Contacts, dentures or bridgework may not be worn into surgery.  Leave suitcase in the car. After surgery it may be brought to your room.                  Please read over the following fact sheets you were given: _____________________________________________________________________             [facility] - Preparing for Surgery Before surgery, you can play an important role.  Because skin is not sterile, your skin needs to be as free of germs as possible.  You can reduce the number of germs on your skin by washing with CHG (chlorahexidine gluconate) soap before surgery.  CHG is an antiseptic cleaner which kills germs and bonds with the skin to continue killing germs even after washing. Please DO NOT use if you have an allergy to CHG or antibacterial soaps.  If your skin becomes reddened/irritated stop using the CHG and inform your nurse when you arrive at Short Stay. Do not shave (including legs and underarms) for at least 48 hours prior to the first  CHG shower.  You may shave your face/neck. Please follow these instructions carefully:  1.  Shower with CHG Soap the night before surgery and the  morning of Surgery.  2.  If you choose to wash your hair, wash your hair  first as usual with your  normal  shampoo.  3.  After you shampoo, rinse your hair and body thoroughly to remove the  shampoo.                           4.  Use CHG as you would any other liquid soap.  You can apply chg directly  to the skin and wash                       Gently with a scrungie or clean washcloth.  5.  Apply the CHG Soap to your body ONLY FROM THE NECK DOWN.   Do not use on face/ open                           Wound or open sores. Avoid contact with eyes, ears mouth and genitals (private parts).                       Wash face,  Genitals (private parts) with your normal soap.             6.  Wash thoroughly, paying special attention to the area where your surgery  will be performed.  7.  Thoroughly rinse your body with warm water from the neck down.  8.  DO NOT shower/wash with your normal soap after using and rinsing off  the CHG Soap.                9.  Pat yourself dry with a clean towel.            10.  Wear clean pajamas.            11.  Place clean sheets on your bed the night of your first shower and do not  sleep with pets. Day of Surgery : Do not apply any lotions/deodorants the morning of surgery.  Please wear clean clothes to the hospital/surgery center.  FAILURE TO FOLLOW THESE INSTRUCTIONS MAY RESULT IN THE CANCELLATION OF YOUR SURGERY PATIENT SIGNATURE_________________________________  NURSE SIGNATURE__________________________________  ________________________________________________________________________   Dean Wilkins  An incentive spirometer is a tool that can help keep your lungs clear and active. This tool measures how well you are filling your lungs with each breath. Taking long deep breaths may help reverse or decrease the chance of developing breathing (pulmonary) problems (especially infection) following:  A long period of time when you are unable to move or be active. BEFORE THE PROCEDURE   If the spirometer includes an indicator to  show your best effort, your nurse or respiratory therapist will set it to a desired goal.  If possible, sit up straight or lean slightly forward. Try not to slouch.  Hold the incentive spirometer in an upright position. INSTRUCTIONS FOR USE  1. Sit on the edge of your bed if possible, or sit up as far as you can in bed or on a chair. 2. Hold the incentive spirometer in an upright position. 3. Breathe out normally. 4. Place the mouthpiece in your mouth and seal your lips tightly around it. 5. Breathe in slowly and as  deeply as possible, raising the piston or the ball toward the top of the column. 6. Hold your breath for 3-5 seconds or for as long as possible. Allow the piston or ball to fall to the bottom of the column. 7. Remove the mouthpiece from your mouth and breathe out normally. 8. Rest for a few seconds and repeat Steps 1 through 7 at least 10 times every 1-2 hours when you are awake. Take your time and take a few normal breaths between deep breaths. 9. The spirometer may include an indicator to show your best effort. Use the indicator as a goal to work toward during each repetition. 10. After each set of 10 deep breaths, practice coughing to be sure your lungs are clear. If you have an incision (the cut made at the time of surgery), support your incision when coughing by placing a pillow or rolled up towels firmly against it. Once you are able to get out of bed, walk around indoors and cough well. You may stop using the incentive spirometer when instructed by your caregiver.  RISKS AND COMPLICATIONS  Take your time so you do not get dizzy or light-headed.  If you are in pain, you may need to take or ask for pain medication before doing incentive spirometry. It is harder to take a deep breath if you are having pain. AFTER USE  Rest and breathe slowly and easily.  It can be helpful to keep track of a log of your progress. Your caregiver can provide you with a simple table to help with  this. If you are using the spirometer at home, follow these instructions: [place] IF:   You are having difficultly using the spirometer.  You have trouble using the spirometer as often as instructed.  Your pain medication is not giving enough relief while using the spirometer.  You develop fever of 100.5 F (38.1 C) or higher. SEEK IMMEDIATE MEDICAL CARE IF:   You cough up bloody sputum that had not been present before.  You develop fever of 102 F (38.9 C) or greater.  You develop worsening pain at or near the incision site. MAKE SURE YOU:   Understand these instructions.  Will watch your condition.  Will get help right away if you are not doing well or get worse. Document Released: 11/18/2006 Document Revised: 09/30/2011 Document Reviewed: 01/19/2007 ExitCare Patient Information 2014 ExitCare, [place].   ________________________________________________________________________  WHAT IS A BLOOD TRANSFUSION? Blood Transfusion Information  A transfusion is the replacement of blood or some of its parts. Blood is made up of multiple cells which provide different functions.  Red blood cells carry oxygen and are used for blood loss replacement.  White blood cells fight against infection.  Platelets control bleeding.  Plasma helps clot blood.  Other blood products are available for specialized needs, such as hemophilia or other clotting disorders. BEFORE THE TRANSFUSION  Who gives blood for transfusions?   Healthy volunteers who are fully evaluated to make sure their blood is safe. This is blood bank blood. Transfusion therapy is the safest it has ever been in the practice of medicine. Before blood is taken from a donor, a complete history is taken to make sure that person has no history of diseases nor engages in risky social behavior (examples are intravenous drug use or sexual activity with multiple partners). The donor's travel history is screened to minimize  risk of transmitting infections, such as malaria. The donated blood is tested for signs of infectious  diseases, such as HIV and hepatitis. The blood is then tested to be sure it is compatible with you in order to minimize the chance of a transfusion reaction. If you or a relative donates blood, this is often done in anticipation of surgery and is not appropriate for emergency situations. It takes many days to process the donated blood. RISKS AND COMPLICATIONS Although transfusion therapy is very safe and saves many lives, the main dangers of transfusion include:   Getting an infectious disease.  Developing a transfusion reaction. This is an allergic reaction to something in the blood you were given. Every precaution is taken to prevent this. The decision to have a blood transfusion has been considered carefully by your caregiver before blood is given. Blood is not given unless the benefits outweigh the risks. AFTER THE TRANSFUSION  Right after receiving a blood transfusion, you will usually feel much better and more energetic. This is especially true if your red blood cells have gotten low (anemic). The transfusion raises the level of the red blood cells which carry oxygen, and this usually causes an energy increase.  The nurse administering the transfusion will monitor you carefully for complications. HOME CARE INSTRUCTIONS  No special instructions are needed after a transfusion. You may find your energy is better. Speak with your caregiver about any limitations on activity for underlying diseases you may have. SEEK MEDICAL CARE IF:   Your condition is not improving after your transfusion.  You develop redness or irritation at the intravenous (IV) site. SEEK IMMEDIATE MEDICAL CARE IF:  Any of the following symptoms occur over the next 12 hours:  Shaking chills.  You have a temperature by mouth above 102 F (38.9 C), not controlled by medicine.  Chest, back, or muscle pain.  People  around you feel you are not acting correctly or are confused.  Shortness of breath or difficulty breathing.  Dizziness and fainting.  You get a rash or develop hives.  You have a decrease in urine output.  Your urine turns a dark color or changes to pink, red, or brown. Any of the following symptoms occur over the next 10 days:  You have a temperature by mouth above 102 F (38.9 C), not controlled by medicine.  Shortness of breath.  Weakness after normal activity.  The white part of the eye turns yellow (jaundice).  You have a decrease in the amount of urine or are urinating less often.  Your urine turns a dark color or changes to pink, red, or brown. Document Released: 07/05/2000 Document Revised: 09/30/2011 Document Reviewed: 02/22/2008 [facility] Patient Information 2014 [place], [place].  _______________________________________________________________________

## 2019-09-09 ENCOUNTER — Encounter (HOSPITAL_COMMUNITY)
Admission: RE | Admit: 2019-09-09 | Discharge: 2019-09-09 | Disposition: A | Discharge status: Home / Self Care | Payer: Medicare Other | Source: Ambulatory Visit | Attending: Orthopedic Surgery | Admitting: Orthopedic Surgery

## 2019-09-09 ENCOUNTER — Encounter (HOSPITAL_COMMUNITY): Payer: Self-pay

## 2019-09-09 ENCOUNTER — Other Ambulatory Visit: Payer: Self-pay

## 2019-09-09 DIAGNOSIS — Z01812 Encounter for preprocedural laboratory examination: Secondary | ICD-10-CM | POA: Insufficient documentation

## 2019-09-09 HISTORY — DX: Cardiac arrhythmia, unspecified: I49.9

## 2019-09-09 NOTE — Progress Notes (Signed)
PCP - Dr. Guadalupe Maple Pulmonary- Dr. Gardiner Rhyme  LOV-03/23/2019 for OSA w/CPAP  epic Cardiologist - Dr. Einar Gip  LOV -03/02/2019 with Nilda Simmer, NP  epic  Chest x-ray - n/a EKG - 03/02/2019  epic Stress Test - 07/02/2012  epic ECHO - 04/30/2017  ( results mentioned in note 03/02/2019) epic Cardiac Cath - 05/27/2017  Left heart cath and CORS /grafts  (poor R wave progression mentioned)  Sleep Study-2019 CPAP-yes  Fasting Blood Sugar - n/a Checks Blood Sugar ___0__ times a day  Blood Thinner Instructions:n/a Aspirin Instructions:Aspirin EC 81 mg- Patient states that nurse told him to stop this on Saturday. Instructed patient to contact Dr. Irven Shelling office to get instructions as to when to stop Aspirin. Last Dose:09/09/2019  Has not stopped Aspirin  Anesthesia review:   Chart to be reviewed by Konrad Felix, PA.  Patient has a history of CAD, CABG x 4 (1999), PTCA, HTN and OSA and on CPAP.  Patient denies shortness of breath, fever, cough and chest pain at PAT appointment   Patient verbalized understanding of instructions that were given to them at the PAT appointment. Patient was also instructed that they will need to review over the PAT instructions again at home before surgery.

## 2019-09-11 ENCOUNTER — Encounter: Payer: Self-pay | Admitting: Cardiology

## 2019-09-13 ENCOUNTER — Other Ambulatory Visit (HOSPITAL_COMMUNITY)
Admission: RE | Admit: 2019-09-13 | Discharge: 2019-09-13 | Disposition: A | Discharge status: Home / Self Care | Payer: Medicare Other | Source: Ambulatory Visit | Attending: Orthopedic Surgery | Admitting: Orthopedic Surgery

## 2019-09-13 ENCOUNTER — Ambulatory Visit: Payer: Self-pay | Admitting: Orthopedic Surgery

## 2019-09-13 DIAGNOSIS — Z20822 Contact with and (suspected) exposure to covid-19: Secondary | ICD-10-CM | POA: Diagnosis not present

## 2019-09-13 DIAGNOSIS — Z01812 Encounter for preprocedural laboratory examination: Secondary | ICD-10-CM | POA: Insufficient documentation

## 2019-09-13 LAB — SARS CORONAVIRUS 2 (TAT 6-24 HRS): SARS Coronavirus 2: NEGATIVE

## 2019-09-13 NOTE — H&P (Signed)
TOTAL HIP ADMISSION H&P  Patient is admitted for left total hip arthroplasty.  Subjective:  Chief Complaint: left hip pain  HPI: Dean Wilkins, 74 y.o. male, has a history of pain and functional disability in the left hip(s) due to arthritis and patient has failed non-surgical conservative treatments for greater than 12 weeks to include NSAID's and/or analgesics, flexibility and strengthening excercises, use of assistive devices, weight reduction as appropriate and activity modification.  Onset of symptoms was gradual starting >10 years ago with gradually worsening course since that time.The patient noted no past surgery on the left hip(s).  Patient currently rates pain in the left hip at 10 out of 10 with activity. Patient has night pain, worsening of pain with activity and weight bearing, trendelenberg gait, pain that interfers with activities of daily living and pain with passive range of motion. Patient has evidence of subchondral cysts, subchondral sclerosis, periarticular osteophytes and joint space narrowing by imaging studies. This condition presents safety issues increasing the risk of falls. There is no current active infection.  Patient Active Problem List   Diagnosis Date Noted  . PVC (premature ventricular contraction) 03/02/2019  . NSVT (nonsustained ventricular tachycardia) ([place]) 03/02/2019  . HTN (hypertension) 03/02/2019  . Atherosclerosis of native coronary artery of native heart with angina pectoris ([place]) 03/02/2019  . HLD (hyperlipidemia) 03/02/2019  . History of coronary artery bypass graft x 3 03/02/2019  . History of PTCA 03/02/2019  . Coronary artery disease 05/25/2017   Past Medical History:  Diagnosis Date  . Aortic sclerosis   . Atherosclerosis of native coronary artery of native heart with angina pectoris ([place]) 03/02/2019  . Coronary artery disease   . Dysrhythmia    PVC's, V-tach  . Exogenous obesity   . Heart murmur   . History of coronary artery bypass  graft x 3 03/02/2019  . History of PTCA 03/02/2019  . HLD (hyperlipidemia) 03/02/2019  . HTN (hypertension) 03/02/2019  . Hyperlipidemia   . Hypertension   . Left ventricular hypertrophy   . NSVT (nonsustained ventricular tachycardia) ([place]) 03/02/2019  . PVC (premature ventricular contraction) 03/02/2019    Past Surgical History:  Procedure Laterality Date  . CORONARY ARTERY BYPASS GRAFT  1999   DES stent to mid native RCA /DES stent to mild RCA  2008/2009  . EYE SURGERY  2019,2020   bilateral cataract surgery with lens implant  . LEFT HEART CATH AND CORS/GRAFTS ANGIOGRAPHY N/A 05/27/2017   Procedure: LEFT HEART CATH AND CORS/GRAFTS ANGIOGRAPHY;  Surgeon: Adrian Prows, MD;  Location: [facility];  Service: Cardiovascular;  Laterality: N/A;  . NM MYOVIEW LTD     2013  . TONSILLECTOMY      Current Outpatient Medications  Medication Sig Dispense Refill Last Dose  . acidophilus (RISAQUAD) CAPS capsule Take 1 capsule by mouth daily.     Marland Kitchen aspirin EC 81 MG tablet Take 81 mg by mouth daily.     . isosorbide mononitrate (IMDUR) 30 MG 24 hr tablet Take 30 mg by mouth every evening.      . metoprolol succinate (TOPROL-XL) 25 MG 24 hr tablet Take 25 mg by mouth daily.     . nitroGLYCERIN (NITROSTAT) 0.4 MG SL tablet Place 0.4 mg under the tongue every 5 (five) minutes as needed for chest pain.     . Omega-3 Fatty Acids (FISH OIL) 1000 MG CAPS Take 2,000 mg by mouth daily.      . quinapril (ACCUPRIL) 20 MG tablet Take 20 mg by  mouth at bedtime.     . simvastatin (ZOCOR) 20 MG tablet Take 20 mg by mouth every evening.      . triamterene-hydrochlorothiazide (MAXZIDE-25) 37.5-25 MG tablet Take 1 tablet by mouth 4 (four) times a week.      . Wheat Dextrin (BENEFIBER DRINK MIX PO) Take 1 Scoop by mouth daily.      No current facility-administered medications for this visit.   Allergies  Allergen Reactions  . Etodolac Other (See Comments)    Unknown allergic reaction.  . Lipitor [Atorvastatin]  Other (See Comments)    Muscle aches/leg pains.  . Niaspan [Niacin Er] Other (See Comments)    Patient cannot remember what the reaction was.    Social History   Tobacco Use  . Smoking status: Former Research scientist (life sciences)  . Smokeless tobacco: Never Used  . Tobacco comment: only smoked for a year  Substance Use Topics  . Alcohol use: Not Currently    Family History  Problem Relation Age of Onset  . Heart attack Brother      Review of Systems  Constitutional: Negative.   HENT: Negative.   Eyes: Negative.   Respiratory: Negative.   Cardiovascular: Negative.   Gastrointestinal: Negative.   Endocrine: Negative.   Genitourinary: Negative.   Musculoskeletal: Positive for arthralgias.  Skin: Negative.   Allergic/Immunologic: Negative.   Neurological: Negative.   Hematological: Negative.   Psychiatric/Behavioral: Negative.     Objective:  Physical Exam  Vitals reviewed. Constitutional: He is oriented to person, place, and time. He appears well-developed and well-nourished.  HENT:  Head: Normocephalic and atraumatic.  Eyes: Pupils are equal, round, and reactive to light. Conjunctivae and EOM are normal.  Cardiovascular: Normal rate, regular rhythm and intact distal pulses.  Respiratory: Effort normal. No respiratory distress.  GI: Soft. He exhibits no distension.  Genitourinary:    Genitourinary Comments: deferred   Musculoskeletal:     Cervical back: Normal range of motion and neck supple.     Left hip: Bony tenderness present. Decreased range of motion.  Neurological: He is alert and oriented to person, place, and time. He has normal reflexes.  Skin: Skin is warm and dry.  Psychiatric: He has a normal mood and affect. His behavior is normal. Judgment and thought content normal.    Vital signs in last 24 hours: @VSRANGES @  Labs:   Estimated body mass index is 31.66 kg/m as calculated from the following:   Height as of 09/09/19: 6\' 1"  (1.854 m).   Weight as of 09/09/19: 108.9  kg.   Imaging Review Plain radiographs demonstrate severe degenerative joint disease of the left hip(s). The bone quality appears to be adequate for age and reported activity level.      Assessment/Plan:  End stage arthritis, left hip(s)  The patient history, physical examination, clinical judgement of the provider and imaging studies are consistent with end stage degenerative joint disease of the left hip(s) and total hip arthroplasty is deemed medically necessary. The treatment options including medical management, injection therapy, arthroscopy and arthroplasty were discussed at length. The risks and benefits of total hip arthroplasty were presented and reviewed. The risks due to aseptic loosening, infection, stiffness, dislocation/subluxation,  thromboembolic complications and other imponderables were discussed.  The patient acknowledged the explanation, agreed to proceed with the plan and consent was signed. Patient is being admitted for inpatient treatment for surgery, pain control, PT, OT, prophylactic antibiotics, VTE prophylaxis, progressive ambulation and ADL's and discharge planning.The patient is planning to be discharged home  with HEP    Patient's anticipated LOS is less than 2 midnights, meeting these requirements: - Younger than 72 - Lives within 1 hour of care - Has a competent adult at home to recover with post-op recover - NO history of  - Chronic pain requiring opiods  - Diabetes  - Heart failure  - Heart attack  - Stroke  - DVT/VTE  - Cardiac arrhythmia  - Respiratory Failure/COPD  - Renal failure  - Anemia  - Advanced Liver disease

## 2019-09-13 NOTE — H&P (View-Only) (Signed)
TOTAL HIP ADMISSION H&P  Patient is admitted for left total hip arthroplasty.  Subjective:  Chief Complaint: left hip pain  HPI: Dean Wilkins, 74 y.o. male, has a history of pain and functional disability in the left hip(s) due to arthritis and patient has failed non-surgical conservative treatments for greater than 12 weeks to include NSAID's and/or analgesics, flexibility and strengthening excercises, use of assistive devices, weight reduction as appropriate and activity modification.  Onset of symptoms was gradual starting >10 years ago with gradually worsening course since that time.The patient noted no past surgery on the left hip(s).  Patient currently rates pain in the left hip at 10 out of 10 with activity. Patient has night pain, worsening of pain with activity and weight bearing, trendelenberg gait, pain that interfers with activities of daily living and pain with passive range of motion. Patient has evidence of subchondral cysts, subchondral sclerosis, periarticular osteophytes and joint space narrowing by imaging studies. This condition presents safety issues increasing the risk of falls. There is no current active infection.  Patient Active Problem List   Diagnosis Date Noted  . PVC (premature ventricular contraction) 03/02/2019  . NSVT (nonsustained ventricular tachycardia) ([place]) 03/02/2019  . HTN (hypertension) 03/02/2019  . Atherosclerosis of native coronary artery of native heart with angina pectoris ([place]) 03/02/2019  . HLD (hyperlipidemia) 03/02/2019  . History of coronary artery bypass graft x 3 03/02/2019  . History of PTCA 03/02/2019  . Coronary artery disease 05/25/2017   Past Medical History:  Diagnosis Date  . Aortic sclerosis   . Atherosclerosis of native coronary artery of native heart with angina pectoris ([place]) 03/02/2019  . Coronary artery disease   . Dysrhythmia    PVC's, V-tach  . Exogenous obesity   . Heart murmur   . History of coronary artery bypass  graft x 3 03/02/2019  . History of PTCA 03/02/2019  . HLD (hyperlipidemia) 03/02/2019  . HTN (hypertension) 03/02/2019  . Hyperlipidemia   . Hypertension   . Left ventricular hypertrophy   . NSVT (nonsustained ventricular tachycardia) ([place]) 03/02/2019  . PVC (premature ventricular contraction) 03/02/2019    Past Surgical History:  Procedure Laterality Date  . CORONARY ARTERY BYPASS GRAFT  1999   DES stent to mid native RCA /DES stent to mild RCA  2008/2009  . EYE SURGERY  2019,2020   bilateral cataract surgery with lens implant  . LEFT HEART CATH AND CORS/GRAFTS ANGIOGRAPHY N/A 05/27/2017   Procedure: LEFT HEART CATH AND CORS/GRAFTS ANGIOGRAPHY;  Surgeon: Adrian Prows, MD;  Location: [facility];  Service: Cardiovascular;  Laterality: N/A;  . NM MYOVIEW LTD     2013  . TONSILLECTOMY      Current Outpatient Medications  Medication Sig Dispense Refill Last Dose  . acidophilus (RISAQUAD) CAPS capsule Take 1 capsule by mouth daily.     Marland Kitchen aspirin EC 81 MG tablet Take 81 mg by mouth daily.     . isosorbide mononitrate (IMDUR) 30 MG 24 hr tablet Take 30 mg by mouth every evening.      . metoprolol succinate (TOPROL-XL) 25 MG 24 hr tablet Take 25 mg by mouth daily.     . nitroGLYCERIN (NITROSTAT) 0.4 MG SL tablet Place 0.4 mg under the tongue every 5 (five) minutes as needed for chest pain.     . Omega-3 Fatty Acids (FISH OIL) 1000 MG CAPS Take 2,000 mg by mouth daily.      . quinapril (ACCUPRIL) 20 MG tablet Take 20 mg by  mouth at bedtime.     . simvastatin (ZOCOR) 20 MG tablet Take 20 mg by mouth every evening.      . triamterene-hydrochlorothiazide (MAXZIDE-25) 37.5-25 MG tablet Take 1 tablet by mouth 4 (four) times a week.      . Wheat Dextrin (BENEFIBER DRINK MIX PO) Take 1 Scoop by mouth daily.      No current facility-administered medications for this visit.   Allergies  Allergen Reactions  . Etodolac Other (See Comments)    Unknown allergic reaction.  . Lipitor [Atorvastatin]  Other (See Comments)    Muscle aches/leg pains.  . Niaspan [Niacin Er] Other (See Comments)    Patient cannot remember what the reaction was.    Social History   Tobacco Use  . Smoking status: Former Research scientist (life sciences)  . Smokeless tobacco: Never Used  . Tobacco comment: only smoked for a year  Substance Use Topics  . Alcohol use: Not Currently    Family History  Problem Relation Age of Onset  . Heart attack Brother      Review of Systems  Constitutional: Negative.   HENT: Negative.   Eyes: Negative.   Respiratory: Negative.   Cardiovascular: Negative.   Gastrointestinal: Negative.   Endocrine: Negative.   Genitourinary: Negative.   Musculoskeletal: Positive for arthralgias.  Skin: Negative.   Allergic/Immunologic: Negative.   Neurological: Negative.   Hematological: Negative.   Psychiatric/Behavioral: Negative.     Objective:  Physical Exam  Vitals reviewed. Constitutional: He is oriented to person, place, and time. He appears well-developed and well-nourished.  HENT:  Head: Normocephalic and atraumatic.  Eyes: Pupils are equal, round, and reactive to light. Conjunctivae and EOM are normal.  Cardiovascular: Normal rate, regular rhythm and intact distal pulses.  Respiratory: Effort normal. No respiratory distress.  GI: Soft. He exhibits no distension.  Genitourinary:    Genitourinary Comments: deferred   Musculoskeletal:     Cervical back: Normal range of motion and neck supple.     Left hip: Bony tenderness present. Decreased range of motion.  Neurological: He is alert and oriented to person, place, and time. He has normal reflexes.  Skin: Skin is warm and dry.  Psychiatric: He has a normal mood and affect. His behavior is normal. Judgment and thought content normal.    Vital signs in last 24 hours: @VSRANGES @  Labs:   Estimated body mass index is 31.66 kg/m as calculated from the following:   Height as of 09/09/19: 6\' 1"  (1.854 m).   Weight as of 09/09/19: 108.9  kg.   Imaging Review Plain radiographs demonstrate severe degenerative joint disease of the left hip(s). The bone quality appears to be adequate for age and reported activity level.      Assessment/Plan:  End stage arthritis, left hip(s)  The patient history, physical examination, clinical judgement of the provider and imaging studies are consistent with end stage degenerative joint disease of the left hip(s) and total hip arthroplasty is deemed medically necessary. The treatment options including medical management, injection therapy, arthroscopy and arthroplasty were discussed at length. The risks and benefits of total hip arthroplasty were presented and reviewed. The risks due to aseptic loosening, infection, stiffness, dislocation/subluxation,  thromboembolic complications and other imponderables were discussed.  The patient acknowledged the explanation, agreed to proceed with the plan and consent was signed. Patient is being admitted for inpatient treatment for surgery, pain control, PT, OT, prophylactic antibiotics, VTE prophylaxis, progressive ambulation and ADL's and discharge planning.The patient is planning to be discharged home  with HEP    Patient's anticipated LOS is less than 2 midnights, meeting these requirements: - Younger than 35 - Lives within 1 hour of care - Has a competent adult at home to recover with post-op recover - NO history of  - Chronic pain requiring opiods  - Diabetes  - Heart failure  - Heart attack  - Stroke  - DVT/VTE  - Cardiac arrhythmia  - Respiratory Failure/COPD  - Renal failure  - Anemia  - Advanced Liver disease

## 2019-09-14 ENCOUNTER — Encounter (HOSPITAL_COMMUNITY)
Admission: RE | Admit: 2019-09-14 | Discharge: 2019-09-14 | Disposition: A | Discharge status: Home / Self Care | Payer: Medicare Other | Source: Ambulatory Visit | Attending: Orthopedic Surgery | Admitting: Orthopedic Surgery

## 2019-09-14 ENCOUNTER — Other Ambulatory Visit: Payer: Self-pay

## 2019-09-14 DIAGNOSIS — I10 Essential (primary) hypertension: Secondary | ICD-10-CM | POA: Insufficient documentation

## 2019-09-14 DIAGNOSIS — E785 Hyperlipidemia, unspecified: Secondary | ICD-10-CM | POA: Insufficient documentation

## 2019-09-14 DIAGNOSIS — Z951 Presence of aortocoronary bypass graft: Secondary | ICD-10-CM | POA: Insufficient documentation

## 2019-09-14 DIAGNOSIS — Z01812 Encounter for preprocedural laboratory examination: Secondary | ICD-10-CM | POA: Insufficient documentation

## 2019-09-14 DIAGNOSIS — Z79899 Other long term (current) drug therapy: Secondary | ICD-10-CM | POA: Insufficient documentation

## 2019-09-14 DIAGNOSIS — M1612 Unilateral primary osteoarthritis, left hip: Secondary | ICD-10-CM | POA: Insufficient documentation

## 2019-09-14 DIAGNOSIS — Z7982 Long term (current) use of aspirin: Secondary | ICD-10-CM | POA: Diagnosis not present

## 2019-09-14 DIAGNOSIS — I2510 Atherosclerotic heart disease of native coronary artery without angina pectoris: Secondary | ICD-10-CM | POA: Insufficient documentation

## 2019-09-14 LAB — CBC
HCT: 47.5 % (ref 39.0–52.0)
Hemoglobin: 16.2 g/dL (ref 13.0–17.0)
MCH: 32.3 pg (ref 26.0–34.0)
MCHC: 34.1 g/dL (ref 30.0–36.0)
MCV: 94.8 fL (ref 80.0–100.0)
Platelets: 133 10*3/uL — ABNORMAL LOW (ref 150–400)
RBC: 5.01 MIL/uL (ref 4.22–5.81)
RDW: 13.1 % (ref 11.5–15.5)
WBC: 8 10*3/uL (ref 4.0–10.5)
nRBC: 0 % (ref 0.0–0.2)

## 2019-09-14 LAB — SURGICAL PCR SCREEN
MRSA, PCR: NEGATIVE
Staphylococcus aureus: NEGATIVE

## 2019-09-14 LAB — PROTIME-INR
INR: 1 (ref 0.8–1.2)
Prothrombin Time: 13.3 seconds (ref 11.4–15.2)

## 2019-09-14 LAB — URINALYSIS, ROUTINE W REFLEX MICROSCOPIC
Bilirubin Urine: NEGATIVE
Glucose, UA: NEGATIVE mg/dL
Hgb urine dipstick: NEGATIVE
Ketones, ur: NEGATIVE mg/dL
Leukocytes,Ua: NEGATIVE
Nitrite: NEGATIVE
Protein, ur: NEGATIVE mg/dL
Specific Gravity, Urine: 1.014 (ref 1.005–1.030)
pH: 6 (ref 5.0–8.0)

## 2019-09-14 LAB — COMPREHENSIVE METABOLIC PANEL
ALT: 20 U/L (ref 0–44)
AST: 23 U/L (ref 15–41)
Albumin: 4.5 g/dL (ref 3.5–5.0)
Alkaline Phosphatase: 60 U/L (ref 38–126)
Anion gap: 10 (ref 5–15)
BUN: 15 mg/dL (ref 8–23)
CO2: 26 mmol/L (ref 22–32)
Calcium: 9.2 mg/dL (ref 8.9–10.3)
Chloride: 100 mmol/L (ref 98–111)
Creatinine, Ser: 0.91 mg/dL (ref 0.61–1.24)
GFR calc Af Amer: >60 mL/min (ref >60)
GFR calc non Af Amer: >60 mL/min (ref >60)
Glucose, Bld: 90 mg/dL (ref 70–99)
Potassium: 4.1 mmol/L (ref 3.5–5.1)
Sodium: 136 mmol/L (ref 135–145)
Total Bilirubin: 1 mg/dL (ref 0.3–1.2)
Total Protein: 7.2 g/dL (ref 6.5–8.1)

## 2019-09-15 NOTE — Anesthesia Preprocedure Evaluation (Addendum)
Anesthesia Evaluation  Patient identified by MRN, date of birth, ID band Patient awake    Reviewed: Allergy & Precautions, NPO status , Patient's Chart, lab work & pertinent test results, reviewed documented beta blocker date and time   History of Anesthesia Complications Negative for: history of anesthetic complications  Airway Mallampati: II  TM Distance: >3 FB Neck ROM: Full    Dental  (+) Dental Advisory Given, Teeth Intact   Pulmonary sleep apnea and Continuous Positive Airway Pressure Ventilation , former smoker,    Pulmonary exam normal        Cardiovascular hypertension, Pt. on medications and Pt. on home beta blockers + CAD, + Cardiac Stents, + CABG and +CHF  Normal cardiovascular exam+ dysrhythmias    Pt last seen by cardiology 03/02/2019.  Seen by Binnie Kand, NP.  Stable at this visit with 1 year follow up recommended  '18 Coronary angiogram: Mild global hypokinesis, EF 45%.  No significant mitral regurgitation. Left main normal.  LAD occluded in the proximal segment. Distal LAD supplied by LIMA, LIMA to LAD widely patent. Native LAD diffusely diseased and small caliber. Circumflex coronary artery proximal segment 40% stenosis, moderate-sized OM1 with high-grade complex acute angle 95% stenosis.  Large OM 2 with mild 30% proximal stenosis.  RCA previously placed stents show about a 30% to 40% in-stent restenosis especially in the proximal segment but does not appear angiographically significant.    Neuro/Psych negative neurological ROS  negative psych ROS   GI/Hepatic negative GI ROS, Neg liver ROS,   Endo/Other   Obesity   Renal/GU negative Renal ROS     Musculoskeletal negative musculoskeletal ROS (+)   Abdominal   Peds  Hematology negative hematology ROS (+)  Thrombocytopenia (Plt 133k)    Anesthesia Other Findings Covid neg 09/13/19   Reproductive/Obstetrics                             Anesthesia Physical Anesthesia Plan  ASA: III  Anesthesia Plan: Spinal   Post-op Pain Management:    Induction:   PONV Risk Score and Plan: 1 and Treatment may vary due to age or medical condition and Propofol infusion  Airway Management Planned: Natural Airway and Simple Face Mask  Additional Equipment: None  Intra-op Plan:   Post-operative Plan:   Informed Consent: I have reviewed the patients History and Physical, chart, labs and discussed the procedure including the risks, benefits and alternatives for the proposed anesthesia with the patient or authorized representative who has indicated his/her understanding and acceptance.       Plan Discussed with: CRNA and Anesthesiologist  Anesthesia Plan Comments: (Labs reviewed, platelets acceptable. Discussed risks and benefits of spinal, including spinal/epidural hematoma, infection, failed block, and PDPH. Patient expressed understanding and wished to proceed. )       Anesthesia Quick Evaluation

## 2019-09-15 NOTE — Progress Notes (Signed)
Anesthesia Chart Review   Case: [id number] Date/Time: 09/16/19 0715   Procedure: TOTAL HIP ARTHROPLASTY ANTERIOR APPROACH (Left Hip)   Anesthesia type: Spinal   Pre-op diagnosis: degenerative joint disease left hip   Location: [facility] 08 / [facility]   Surgeons: Rod Can, MD      DISCUSSION:74 y.o. former smoker with h/o CAD (CABG 1999 with patent LIMA-lAD on cath in 05/2017, multiple PCI to RCA, severe OM1 stenosis, mildly reduced LVEF 45%), HLD, HTN, PVCs, OSA on CPAP, left hip djd scheduled for above procedure 09/16/19 with Dr. Rod Can.   Pt last seen by cardiology 03/02/2019.  Seen by Binnie Kand, NP.  Stable at this visit with 1 year follow up recommended.   Anticipate pt can proceed with planned procedure barring acute status change.   VS: BP (!) 143/84   Pulse 69   Temp 36.8 C (Oral)   Resp 16   Ht 6\' 1"  (1.854 m)   Wt 108.9 kg   SpO2 100%   BMI 31.66 kg/m   PROVIDERS: Guadalupe Maple, MD is PCP   Adrian Prows, MD is Cardiologist  LABS: labs forwarded to surgeon (all labs ordered are listed, but only abnormal results are displayed)  Labs Reviewed  CBC - Abnormal; Notable for the following components:      Result Value   Platelets 133 (*)    All other components within normal limits  SURGICAL PCR SCREEN  COMPREHENSIVE METABOLIC PANEL  PROTIME-INR  URINALYSIS, ROUTINE W REFLEX MICROSCOPIC  TYPE AND SCREEN     IMAGES:   EKG: 03/02/2019 Rate 61 bpm Normal sinus rhythm at 61 bpm with first degree AV block, normal axis, Poor R wave progression, cannot exclude anterior infarct old. No evidence of ischemia.   CV: Coronary angiogram 05/27/2017: Mild global hypokinesis, EF 45%.  No significant mitral regurgitation.  Normal LVEDP.  Left main normal.  LAD occluded in the proximal segment.  Distal LAD supplied by LIMA, LIMA to LAD widely patent.  Native LAD diffusely diseased and small caliber.  Circumflex coronary artery proximal segment 40% stenosis,  moderate-sized OM1 with high-grade complex acute angle 95% stenosis.  Large OM 2 with mild 30% proximal stenosis.  RCA previously placed stents show about a 30% to 40% in-stent restenosis especially in the proximal segment but does not appear angiographically significant.  Recommendation: Patient's nuclear stress test is probably abnormal due to a moderate-sized OM1 high-grade stenosis.  However the anatomy is not very conducive for angioplasty, would recommend medical therapy.  Overall low risk for sudden cardiac death. Complex coronary anatomy, left radial access utilized, AL to catheter utilized to engage left main.  160 mL contrast utilized.  There was no immediate complication.  He will be discharged home today with outpatient follow-up.  Myocardial Perfusion 09/04/2011 Impression Exercise Capacity:  Lexiscan with no exercise. BP Response:  Normal blood pressure response. Clinical Symptoms:  No significant symptoms noted. ECG Impression:  No significant ST segment change suggestive of ischemia. Comparison with Prior Nuclear Study: No significant change from previous study  Overall Impression:  Low risk stress nuclear study.  LV Wall Motion:  Nl overall LV fxn with mild anterlateral hypokinesis Past Medical History:  Diagnosis Date  . Aortic sclerosis   . Atherosclerosis of native coronary artery of native heart with angina pectoris ([place]) 03/02/2019  . Coronary artery disease   . Dysrhythmia    PVC's, V-tach  . Exogenous obesity   . Heart murmur   . History of coronary  artery bypass graft x 3 03/02/2019  . History of PTCA 03/02/2019  . HLD (hyperlipidemia) 03/02/2019  . HTN (hypertension) 03/02/2019  . Hyperlipidemia   . Hypertension   . Left ventricular hypertrophy   . NSVT (nonsustained ventricular tachycardia) ([place]) 03/02/2019  . PVC (premature ventricular contraction) 03/02/2019    Past Surgical History:  Procedure Laterality Date  . CORONARY ARTERY BYPASS GRAFT  1999   DES  stent to mid native RCA /DES stent to mild RCA  2008/2009  . EYE SURGERY  2019,2020   bilateral cataract surgery with lens implant  . LEFT HEART CATH AND CORS/GRAFTS ANGIOGRAPHY N/A 05/27/2017   Procedure: LEFT HEART CATH AND CORS/GRAFTS ANGIOGRAPHY;  Surgeon: Adrian Prows, MD;  Location: [facility];  Service: Cardiovascular;  Laterality: N/A;  . NM MYOVIEW LTD     2013  . TONSILLECTOMY      MEDICATIONS: . acidophilus (RISAQUAD) CAPS capsule  . aspirin EC 81 MG tablet  . isosorbide mononitrate (IMDUR) 30 MG 24 hr tablet  . metoprolol succinate (TOPROL-XL) 25 MG 24 hr tablet  . nitroGLYCERIN (NITROSTAT) 0.4 MG SL tablet  . Omega-3 Fatty Acids (FISH OIL) 1000 MG CAPS  . quinapril (ACCUPRIL) 20 MG tablet  . simvastatin (ZOCOR) 20 MG tablet  . triamterene-hydrochlorothiazide (MAXZIDE-25) 37.5-25 MG tablet  . Wheat Dextrin (BENEFIBER DRINK MIX PO)   No current facility-administered medications for this encounter.    Konrad Felix, PA-C [facility] [phone] 09/15/19  10:14 AM

## 2019-09-16 ENCOUNTER — Ambulatory Visit (HOSPITAL_COMMUNITY): Payer: Medicare Other | Admitting: Physician Assistant

## 2019-09-16 ENCOUNTER — Encounter (HOSPITAL_COMMUNITY)
Admission: RE | Disposition: A | Discharge status: Home / Self Care | Payer: Self-pay | Source: Home / Self Care | Attending: Orthopedic Surgery

## 2019-09-16 ENCOUNTER — Ambulatory Visit (HOSPITAL_COMMUNITY): Payer: Medicare Other

## 2019-09-16 ENCOUNTER — Encounter (HOSPITAL_COMMUNITY): Payer: Self-pay | Admitting: Orthopedic Surgery

## 2019-09-16 ENCOUNTER — Observation Stay (HOSPITAL_COMMUNITY)
Admission: RE | Admit: 2019-09-16 | Discharge: 2019-09-16 | Disposition: A | Discharge status: Home / Self Care | Payer: Medicare Other | Attending: Orthopedic Surgery | Admitting: Orthopedic Surgery

## 2019-09-16 DIAGNOSIS — I25119 Atherosclerotic heart disease of native coronary artery with unspecified angina pectoris: Secondary | ICD-10-CM | POA: Diagnosis not present

## 2019-09-16 DIAGNOSIS — Z951 Presence of aortocoronary bypass graft: Secondary | ICD-10-CM | POA: Insufficient documentation

## 2019-09-16 DIAGNOSIS — Z419 Encounter for procedure for purposes other than remedying health state, unspecified: Secondary | ICD-10-CM

## 2019-09-16 DIAGNOSIS — M1612 Unilateral primary osteoarthritis, left hip: Principal | ICD-10-CM | POA: Diagnosis present

## 2019-09-16 DIAGNOSIS — Z471 Aftercare following joint replacement surgery: Secondary | ICD-10-CM | POA: Diagnosis not present

## 2019-09-16 DIAGNOSIS — Z96642 Presence of left artificial hip joint: Secondary | ICD-10-CM | POA: Diagnosis not present

## 2019-09-16 DIAGNOSIS — Z79899 Other long term (current) drug therapy: Secondary | ICD-10-CM | POA: Diagnosis not present

## 2019-09-16 DIAGNOSIS — Z6831 Body mass index (BMI) 31.0-31.9, adult: Secondary | ICD-10-CM | POA: Diagnosis not present

## 2019-09-16 DIAGNOSIS — E785 Hyperlipidemia, unspecified: Secondary | ICD-10-CM | POA: Diagnosis not present

## 2019-09-16 DIAGNOSIS — I493 Ventricular premature depolarization: Secondary | ICD-10-CM | POA: Diagnosis not present

## 2019-09-16 DIAGNOSIS — Z7982 Long term (current) use of aspirin: Secondary | ICD-10-CM | POA: Insufficient documentation

## 2019-09-16 DIAGNOSIS — E6609 Other obesity due to excess calories: Secondary | ICD-10-CM | POA: Diagnosis not present

## 2019-09-16 DIAGNOSIS — Z09 Encounter for follow-up examination after completed treatment for conditions other than malignant neoplasm: Secondary | ICD-10-CM

## 2019-09-16 DIAGNOSIS — I10 Essential (primary) hypertension: Secondary | ICD-10-CM | POA: Insufficient documentation

## 2019-09-16 DIAGNOSIS — Z87891 Personal history of nicotine dependence: Secondary | ICD-10-CM | POA: Diagnosis not present

## 2019-09-16 DIAGNOSIS — Z8249 Family history of ischemic heart disease and other diseases of the circulatory system: Secondary | ICD-10-CM | POA: Insufficient documentation

## 2019-09-16 DIAGNOSIS — I2510 Atherosclerotic heart disease of native coronary artery without angina pectoris: Secondary | ICD-10-CM | POA: Diagnosis not present

## 2019-09-16 HISTORY — PX: TOTAL HIP ARTHROPLASTY: SHX124

## 2019-09-16 SURGERY — ARTHROPLASTY, HIP, TOTAL, ANTERIOR APPROACH
Anesthesia: Spinal | Site: Hip | Laterality: Left

## 2019-09-16 MED ORDER — SODIUM CHLORIDE 0.9 % IV SOLN: INTRAVENOUS | Status: DC

## 2019-09-16 MED ORDER — DOCUSATE SODIUM 100 MG PO CAPS: 100.0000 mg | ORAL_CAPSULE | Freq: Two times a day (BID) | ORAL | 1 refills | Status: AC

## 2019-09-16 MED ORDER — PHENYLEPHRINE HCL (PRESSORS) 10 MG/ML IV SOLN
INTRAVENOUS | Status: DC | PRN
  Administered 2019-09-16: 40 ug via INTRAVENOUS
  Administered 2019-09-16 (×4): 80 ug via INTRAVENOUS

## 2019-09-16 MED ORDER — BUPIVACAINE HCL (PF) 0.25 % IJ SOLN
INTRAMUSCULAR | Status: DC | PRN
  Administered 2019-09-16: 30 mL via INTRA_ARTICULAR

## 2019-09-16 MED ORDER — LACTATED RINGERS IV BOLUS
250.0000 mL | Freq: Once | INTRAVENOUS | Status: AC
  Administered 2019-09-16: 250 mL via INTRAVENOUS

## 2019-09-16 MED ORDER — ISOPROPYL ALCOHOL 70 % SOLN
Status: AC
  Filled 2019-09-16: qty 480

## 2019-09-16 MED ORDER — CHLORHEXIDINE GLUCONATE 4 % EX LIQD: 60.0000 mL | Freq: Once | CUTANEOUS | Status: DC

## 2019-09-16 MED ORDER — TRANEXAMIC ACID-NACL 1000-0.7 MG/100ML-% IV SOLN
1000.0000 mg | INTRAVENOUS | Status: AC
  Administered 2019-09-16: 08:00:00 1000 mg via INTRAVENOUS
  Filled 2019-09-16: qty 100

## 2019-09-16 MED ORDER — ONDANSETRON HCL 4 MG PO TABS: 4.0000 mg | ORAL_TABLET | Freq: Three times a day (TID) | ORAL | 0 refills | Status: DC | PRN

## 2019-09-16 MED ORDER — WATER FOR IRRIGATION, STERILE IR SOLN
Status: DC | PRN
  Administered 2019-09-16: 2000 mL

## 2019-09-16 MED ORDER — 0.9 % SODIUM CHLORIDE (POUR BTL) OPTIME
TOPICAL | Status: DC | PRN
  Administered 2019-09-16: 09:00:00 1000 mL

## 2019-09-16 MED ORDER — KETOROLAC TROMETHAMINE 30 MG/ML IJ SOLN
INTRAMUSCULAR | Status: AC
  Filled 2019-09-16: qty 1

## 2019-09-16 MED ORDER — DEXAMETHASONE SODIUM PHOSPHATE 10 MG/ML IJ SOLN
INTRAMUSCULAR | Status: DC | PRN
  Administered 2019-09-16: 5 mg via INTRAVENOUS

## 2019-09-16 MED ORDER — HYDROCODONE-ACETAMINOPHEN 5-325 MG PO TABS: 1.0000 | ORAL_TABLET | ORAL | 0 refills | Status: DC | PRN

## 2019-09-16 MED ORDER — SENNA 8.6 MG PO TABS: 2.0000 | ORAL_TABLET | Freq: Every day | ORAL | 1 refills | Status: AC

## 2019-09-16 MED ORDER — ASPIRIN 81 MG PO CHEW: 81.0000 mg | CHEWABLE_TABLET | Freq: Two times a day (BID) | ORAL | 0 refills | Status: AC

## 2019-09-16 MED ORDER — PROPOFOL 1000 MG/100ML IV EMUL
INTRAVENOUS | Status: AC
  Filled 2019-09-16: qty 100

## 2019-09-16 MED ORDER — METHOCARBAMOL 500 MG IVPB - SIMPLE MED: 500.0000 mg | Freq: Four times a day (QID) | INTRAVENOUS | Status: DC | PRN

## 2019-09-16 MED ORDER — POVIDONE-IODINE 10 % EX SWAB
2.0000 "application " | Freq: Once | CUTANEOUS | Status: AC
  Administered 2019-09-16: 2 via TOPICAL

## 2019-09-16 MED ORDER — BUPIVACAINE IN DEXTROSE 0.75-8.25 % IT SOLN
INTRATHECAL | Status: DC | PRN
  Administered 2019-09-16: 1.6 mL via INTRATHECAL

## 2019-09-16 MED ORDER — SODIUM CHLORIDE (PF) 0.9 % IJ SOLN
INTRAMUSCULAR | Status: AC
  Filled 2019-09-16: qty 50

## 2019-09-16 MED ORDER — ISOPROPYL ALCOHOL 70 % SOLN
Status: DC | PRN
  Administered 2019-09-16: 1 via TOPICAL

## 2019-09-16 MED ORDER — FENTANYL CITRATE (PF) 100 MCG/2ML IJ SOLN: 25.0000 ug | INTRAMUSCULAR | Status: DC | PRN

## 2019-09-16 MED ORDER — PROPOFOL 10 MG/ML IV BOLUS
INTRAVENOUS | Status: AC
  Filled 2019-09-16: qty 20

## 2019-09-16 MED ORDER — PHENYLEPHRINE HCL (PRESSORS) 10 MG/ML IV SOLN
INTRAVENOUS | Status: AC
  Filled 2019-09-16: qty 1

## 2019-09-16 MED ORDER — ONDANSETRON HCL 4 MG/2ML IJ SOLN: 4.0000 mg | Freq: Once | INTRAMUSCULAR | Status: DC | PRN

## 2019-09-16 MED ORDER — ACETAMINOPHEN 325 MG PO TABS: 325.0000 mg | ORAL_TABLET | Freq: Four times a day (QID) | ORAL | Status: DC | PRN

## 2019-09-16 MED ORDER — OXYCODONE HCL 5 MG/5ML PO SOLN: 5.0000 mg | Freq: Once | ORAL | Status: DC | PRN

## 2019-09-16 MED ORDER — PROPOFOL 500 MG/50ML IV EMUL
INTRAVENOUS | Status: DC | PRN
  Administered 2019-09-16: 75 ug/kg/min via INTRAVENOUS

## 2019-09-16 MED ORDER — LACTATED RINGERS IV SOLN: INTRAVENOUS | Status: DC

## 2019-09-16 MED ORDER — SODIUM CHLORIDE (PF) 0.9 % IJ SOLN
INTRAMUSCULAR | Status: DC | PRN
  Administered 2019-09-16: 30 mL

## 2019-09-16 MED ORDER — LACTATED RINGERS IV BOLUS
500.0000 mL | Freq: Once | INTRAVENOUS | Status: AC
  Administered 2019-09-16: 500 mL via INTRAVENOUS

## 2019-09-16 MED ORDER — IRRISEPT - 450ML BOTTLE WITH 0.05% CHG IN STERILE WATER, USP 99.95% OPTIME
TOPICAL | Status: DC | PRN
  Administered 2019-09-16: 09:00:00 450 mL

## 2019-09-16 MED ORDER — FENTANYL CITRATE (PF) 100 MCG/2ML IJ SOLN
INTRAMUSCULAR | Status: AC
  Filled 2019-09-16: qty 2

## 2019-09-16 MED ORDER — ONDANSETRON HCL 4 MG/2ML IJ SOLN
INTRAMUSCULAR | Status: DC | PRN
  Administered 2019-09-16: 4 mg via INTRAVENOUS

## 2019-09-16 MED ORDER — SODIUM CHLORIDE 0.9 % IR SOLN
Status: DC | PRN
  Administered 2019-09-16: 1000 mL

## 2019-09-16 MED ORDER — PHENYLEPHRINE HCL-NACL 10-0.9 MG/250ML-% IV SOLN
INTRAVENOUS | Status: DC | PRN
  Administered 2019-09-16: 30 ug/min via INTRAVENOUS

## 2019-09-16 MED ORDER — POVIDONE-IODINE 10 % EX SWAB: 2.0000 "application " | Freq: Once | CUTANEOUS | Status: DC

## 2019-09-16 MED ORDER — ACETAMINOPHEN 10 MG/ML IV SOLN
1000.0000 mg | INTRAVENOUS | Status: AC
  Administered 2019-09-16: 08:00:00 1000 mg via INTRAVENOUS
  Filled 2019-09-16: qty 100

## 2019-09-16 MED ORDER — METHOCARBAMOL 500 MG PO TABS: 500.0000 mg | ORAL_TABLET | Freq: Four times a day (QID) | ORAL | Status: DC | PRN

## 2019-09-16 MED ORDER — OXYCODONE HCL 5 MG PO TABS: 5.0000 mg | ORAL_TABLET | Freq: Once | ORAL | Status: DC | PRN

## 2019-09-16 MED ORDER — BUPIVACAINE HCL 0.25 % IJ SOLN
INTRAMUSCULAR | Status: AC
  Filled 2019-09-16: qty 1

## 2019-09-16 MED ORDER — CEFAZOLIN SODIUM-DEXTROSE 2-4 GM/100ML-% IV SOLN
2.0000 g | INTRAVENOUS | Status: AC
  Administered 2019-09-16: 2 g via INTRAVENOUS
  Filled 2019-09-16: qty 100

## 2019-09-16 MED ORDER — SODIUM CHLORIDE 0.9 % IR SOLN
Status: DC | PRN
  Administered 2019-09-16: 3000 mL

## 2019-09-16 MED ORDER — FENTANYL CITRATE (PF) 100 MCG/2ML IJ SOLN
INTRAMUSCULAR | Status: DC | PRN
  Administered 2019-09-16 (×2): 50 ug via INTRAVENOUS

## 2019-09-16 SURGICAL SUPPLY — 59 items
ADH SKN CLS APL DERMABOND .7 (GAUZE/BANDAGES/DRESSINGS) ×2
APL PRP STRL LF DISP 70% ISPRP (MISCELLANEOUS) ×1
BAG DECANTER FOR FLEXI CONT (MISCELLANEOUS) IMPLANT
BAG SPEC THK2 15X12 ZIP CLS (MISCELLANEOUS) ×1
BAG ZIPLOCK 12X15 (MISCELLANEOUS) ×2 IMPLANT
BLADE SURG SZ10 CARB STEEL (BLADE) IMPLANT
CHLORAPREP W/TINT 26 (MISCELLANEOUS) ×3 IMPLANT
COVER PERINEAL POST (MISCELLANEOUS) ×3 IMPLANT
COVER SURGICAL LIGHT HANDLE (MISCELLANEOUS) ×3 IMPLANT
COVER WAND RF STERILE (DRAPES) IMPLANT
CUP SECTOR GRIPTON 58MM (Orthopedic Implant) ×2 IMPLANT
DECANTER SPIKE VIAL GLASS SM (MISCELLANEOUS) ×5 IMPLANT
DERMABOND ADVANCED (GAUZE/BANDAGES/DRESSINGS) ×4
DERMABOND ADVANCED .7 DNX12 (GAUZE/BANDAGES/DRESSINGS) ×2 IMPLANT
DRAPE IMP U-DRAPE 54X76 (DRAPES) ×3 IMPLANT
DRAPE SHEET LG 3/4 BI-LAMINATE (DRAPES) ×9 IMPLANT
DRAPE STERI IOBAN 125X83 (DRAPES) IMPLANT
DRAPE U-SHAPE 47X51 STRL (DRAPES) ×6 IMPLANT
DRSG AQUACEL AG ADV 3.5X10 (GAUZE/BANDAGES/DRESSINGS) ×3 IMPLANT
ELECT REM PT RETURN 15FT ADLT (MISCELLANEOUS) ×3 IMPLANT
GAUZE SPONGE 4X4 12PLY STRL (GAUZE/BANDAGES/DRESSINGS) ×3 IMPLANT
GLOVE BIO SURGEON STRL SZ8.5 (GLOVE) ×6 IMPLANT
GLOVE BIOGEL PI IND STRL 8.5 (GLOVE) ×1 IMPLANT
GLOVE BIOGEL PI INDICATOR 8.5 (GLOVE) ×2
GOWN SPEC L3 XXLG W/TWL (GOWN DISPOSABLE) ×3 IMPLANT
HANDPIECE INTERPULSE COAX TIP (DISPOSABLE) ×3
HEAD CERAMIC 36 PLUS5 (Hips) ×2 IMPLANT
HOLDER FOLEY CATH W/STRAP (MISCELLANEOUS) ×3 IMPLANT
HOOD PEEL AWAY FLYTE STAYCOOL (MISCELLANEOUS) ×12 IMPLANT
JET LAVAGE IRRISEPT WOUND (IRRIGATION / IRRIGATOR) ×3
KIT TURNOVER KIT A (KITS) IMPLANT
LAVAGE JET IRRISEPT WOUND (IRRIGATION / IRRIGATOR) ×1 IMPLANT
LINER NEUTRAL 52X36X58N (Liner) ×2 IMPLANT
MANIFOLD NEPTUNE II (INSTRUMENTS) ×3 IMPLANT
MARKER SKIN DUAL TIP RULER LAB (MISCELLANEOUS) ×3 IMPLANT
NDL SAFETY ECLIPSE 18X1.5 (NEEDLE) ×1 IMPLANT
NDL SPNL 18GX3.5 QUINCKE PK (NEEDLE) ×1 IMPLANT
NEEDLE HYPO 18GX1.5 SHARP (NEEDLE) ×3
NEEDLE SPNL 18GX3.5 QUINCKE PK (NEEDLE) ×3 IMPLANT
PACK ANTERIOR HIP CUSTOM (KITS) ×3 IMPLANT
PENCIL SMOKE EVACUATOR (MISCELLANEOUS) ×2 IMPLANT
SAW OSC TIP CART 19.5X105X1.3 (SAW) ×3 IMPLANT
SEALER BIPOLAR AQUA 6.0 (INSTRUMENTS) ×3 IMPLANT
SET HNDPC FAN SPRY TIP SCT (DISPOSABLE) ×1 IMPLANT
STEM TRI LOC GRIPTION SZ 9 STD IMPLANT
SUT ETHIBOND NAB CT1 #1 30IN (SUTURE) ×6 IMPLANT
SUT MNCRL AB 3-0 PS2 18 (SUTURE) ×3 IMPLANT
SUT MNCRL AB 4-0 PS2 18 (SUTURE) ×3 IMPLANT
SUT MON AB 2-0 CT1 36 (SUTURE) ×6 IMPLANT
SUT STRATAFIX PDO 1 14 VIOLET (SUTURE) ×3
SUT STRATFX PDO 1 14 VIOLET (SUTURE) ×1
SUT VIC AB 2-0 CT1 27 (SUTURE) ×3
SUT VIC AB 2-0 CT1 TAPERPNT 27 (SUTURE) ×1 IMPLANT
SUTURE STRATFX PDO 1 14 VIOLET (SUTURE) ×1 IMPLANT
SYR 3ML LL SCALE MARK (SYRINGE) ×3 IMPLANT
TRAY FOLEY MTR SLVR 16FR STAT (SET/KITS/TRAYS/PACK) ×2 IMPLANT
TRI LOC GRIPTION SZ 9 STD ×3 IMPLANT
WATER STERILE IRR 1000ML POUR (IV SOLUTION) ×3 IMPLANT
YANKAUER SUCT BULB TIP 10FT TU (MISCELLANEOUS) ×3 IMPLANT

## 2019-09-16 NOTE — Transfer of Care (Signed)
Immediate Anesthesia Transfer of Care Note  Patient: Engineer, production  Procedure(s) Performed: TOTAL HIP ARTHROPLASTY ANTERIOR APPROACH (Left Hip)  Patient Location: PACU  Anesthesia Type:Spinal  Level of Consciousness: awake, alert  and oriented  Airway & Oxygen Therapy: Patient Spontanous Breathing and Patient connected to face mask oxygen  Post-op Assessment: Report given to RN and Post -op Vital signs reviewed and stable  Post vital signs: Reviewed and stable  Last Vitals:  Vitals Value Taken Time  BP 101/67 09/16/19 1015  Temp    Pulse 58 09/16/19 1016  Resp 13 09/16/19 1016  SpO2 94 % 09/16/19 1016  Vitals shown include unvalidated device data.  Last Pain:  Vitals:   09/16/19 0618  TempSrc:   PainSc: 0-No pain         Complications: No apparent anesthesia complications

## 2019-09-16 NOTE — Discharge Instructions (Signed)
°Dr. Shakena Callari °Joint Replacement Specialist °[facility] °[address] °[place], [place] [address] °[phone] ° ° °TOTAL HIP REPLACEMENT POSTOPERATIVE DIRECTIONS ° ° ° °Hip Rehabilitation, Guidelines Following Surgery  ° °WEIGHT BEARING °Weight bearing as tolerated with assist device (walker, cane, etc) as directed, use it as long as suggested by your surgeon or therapist, typically at least 4-6 weeks. ° °The results of a hip operation are greatly improved after range of motion and muscle strengthening exercises. Follow all safety measures which are given to protect your hip. If any of these exercises cause increased pain or swelling in your joint, decrease the amount until you are comfortable again. Then slowly increase the exercises. Call your caregiver if you have problems or questions.  ° °HOME CARE INSTRUCTIONS  °Most of the following instructions are designed to prevent the dislocation of your new hip.  °Remove items at home which could result in a fall. This includes throw rugs or furniture in walking pathways.  °Continue medications as instructed at time of discharge. °· You may have some home medications which will be placed on hold until you complete the course of blood thinner medication. °· You may start showering once you are discharged home. Do not remove your dressing. °Do not put on socks or shoes without following the instructions of your caregivers.   °Sit on chairs with arms. Use the chair arms to help push yourself up when arising.  °Arrange for the use of a toilet seat elevator so you are not sitting low.  °· Walk with walker as instructed.  °You may resume a sexual relationship in one month or when given the OK by your caregiver.  °Use walker as long as suggested by your caregivers.  °You may put full weight on your legs and walk as much as is comfortable. °Avoid periods of inactivity such as sitting longer than an hour when not asleep. This helps prevent  blood clots.  °You may return to work once you are cleared by your surgeon.  °Do not drive a car for 6 weeks or until released by your surgeon.  °Do not drive while taking narcotics.  °Wear elastic stockings for two weeks following surgery during the day but you may remove then at night.  °Make sure you keep all of your appointments after your operation with all of your doctors and caregivers. You should call the office at the above phone number and make an appointment for approximately two weeks after the date of your surgery. °Please pick up a stool softener and laxative for home use as long as you are requiring pain medications. °· ICE to the affected hip every three hours for 30 minutes at a time and then as needed for pain and swelling. Continue to use ice on the hip for pain and swelling from surgery. You may notice swelling that will progress down to the foot and ankle.  This is normal after surgery.  Elevate the leg when you are not up walking on it.   °It is important for you to complete the blood thinner medication as prescribed by your doctor. °· Continue to use the breathing machine which will help keep your temperature down.  It is common for your temperature to cycle up and down following surgery, especially at night when you are not up moving around and exerting yourself.  The breathing machine keeps your lungs expanded and your temperature down. ° °RANGE OF MOTION AND STRENGTHENING EXERCISES  °These exercises are   designed to help you keep full movement of your hip joint. Follow your caregiver's or physical therapist's instructions. Perform all exercises about fifteen times, three times per day or as directed. Exercise both hips, even if you have had only one joint replacement. These exercises can be done on a training (exercise) mat, on the floor, on a table or on a bed. Use whatever works the best and is most comfortable for you. Use music or television while you are exercising so that the exercises  are a pleasant break in your day. This will make your life better with the exercises acting as a break in routine you can look forward to.  °Lying on your back, slowly slide your foot toward your buttocks, raising your knee up off the floor. Then slowly slide your foot back down until your leg is straight again.  °Lying on your back spread your legs as far apart as you can without causing discomfort.  °Lying on your side, raise your upper leg and foot straight up from the floor as far as is comfortable. Slowly lower the leg and repeat.  °Lying on your back, tighten up the muscle in the front of your thigh (quadriceps muscles). You can do this by keeping your leg straight and trying to raise your heel off the floor. This helps strengthen the largest muscle supporting your knee.  °Lying on your back, tighten up the muscles of your buttocks both with the legs straight and with the knee bent at a comfortable angle while keeping your heel on the floor.  ° °SKILLED REHAB INSTRUCTIONS: °If the patient is transferred to a skilled rehab facility following release from the hospital, a list of the current medications will be sent to the facility for the patient to continue.  When discharged from the skilled rehab facility, please have the facility set up the patient's Home Health Physical Therapy prior to being released. Also, the skilled facility will be responsible for providing the patient with their medications at time of release from the facility to include their pain medication and their blood thinner medication. If the patient is still at the rehab facility at time of the two week follow up appointment, the skilled rehab facility will also need to assist the patient in arranging follow up appointment in our office and any transportation needs. ° °MAKE SURE YOU:  °Understand these instructions.  °Will watch your condition.  °Will get help right away if you are not doing well or get worse. ° °Pick up stool softner and  laxative for home use following surgery while on pain medications. °Do not remove your dressing. °The dressing is waterproof--it is OK to take showers. °Continue to use ice for pain and swelling after surgery. °Do not use any lotions or creams on the incision until instructed by your surgeon. °Total Hip Protocol. ° ° °

## 2019-09-16 NOTE — Discharge Summary (Signed)
Physician Discharge Summary  Patient ID: Dean Wilkins MRN: [id number] DOB/AGE: 02-11-1946 74 y.o.  Admit date: 09/16/2019 Discharge date: 09/16/2019  Admission Diagnoses:  Osteoarthritis of left hip  Discharge Diagnoses:  Principal Problem:   Osteoarthritis of left hip   Past Medical History:  Diagnosis Date  . Aortic sclerosis   . Atherosclerosis of native coronary artery of native heart with angina pectoris ([place]) 03/02/2019  . Coronary artery disease   . Dysrhythmia    PVC's, V-tach  . Exogenous obesity   . Heart murmur   . History of coronary artery bypass graft x 3 03/02/2019  . History of PTCA 03/02/2019  . HLD (hyperlipidemia) 03/02/2019  . HTN (hypertension) 03/02/2019  . Hyperlipidemia   . Hypertension   . Left ventricular hypertrophy   . NSVT (nonsustained ventricular tachycardia) ([place]) 03/02/2019  . PVC (premature ventricular contraction) 03/02/2019    Surgeries: Procedure(s): TOTAL HIP ARTHROPLASTY ANTERIOR APPROACH on 09/16/2019   Consultants (if any):   Discharged Condition: Improved  Hospital Course: Dean Wilkins is an 74 y.o. male who was admitted 09/16/2019 with a diagnosis of Osteoarthritis of left hip and went to the operating room on 09/16/2019 and underwent the above named procedures.    He was given perioperative antibiotics:  Anti-infectives (From admission, onward)   Start     Dose/Rate Route Frequency Ordered Stop   09/16/19 0600  ceFAZolin (ANCEF) IVPB 2g/100 mL premix     2 g 200 mL/hr over 30 Minutes Intravenous On call to O.R. 09/16/19 [id number] 09/16/19 0825    .  He was given sequential compression devices, early ambulation, and ASA for DVT prophylaxis.  He benefited maximally from the hospital stay and there were no complications.    Recent vital signs:  Vitals:   09/16/19 0615  BP: 117/79  Pulse: 72  Resp: 18  Temp: 98.3 F (36.8 C)  SpO2: 96%    Recent laboratory studies:  Lab Results  Component Value Date   HGB 16.2  09/14/2019   HGB 15.1 10/26/2008   HGB 14.9 10/25/2008   Lab Results  Component Value Date   WBC 8.0 09/14/2019   PLT 133 (L) 09/14/2019   Lab Results  Component Value Date   INR 1.0 09/14/2019   Lab Results  Component Value Date   NA 136 09/14/2019   K 4.1 09/14/2019   CL 100 09/14/2019   CO2 26 09/14/2019   BUN 15 09/14/2019   CREATININE 0.91 09/14/2019   GLUCOSE 90 09/14/2019    Discharge Medications:   Allergies as of 09/16/2019      Reactions   Etodolac Other (See Comments)   Unknown allergic reaction.   Lipitor [atorvastatin] Other (See Comments)   Muscle aches/leg pains.   Niaspan [niacin Er] Other (See Comments)   Patient cannot remember what the reaction was.      Medication List    STOP taking these medications   aspirin EC 81 MG tablet Replaced by: aspirin 81 MG chewable tablet     TAKE these medications   acidophilus Caps capsule Take 1 capsule by mouth daily.   aspirin 81 MG chewable tablet Commonly known as: Aspirin Childrens Chew 1 tablet (81 mg total) by mouth 2 (two) times daily with a meal. Replaces: aspirin EC 81 MG tablet   BENEFIBER DRINK MIX PO Take 1 Scoop by mouth daily.   docusate sodium 100 MG capsule Commonly known as: Colace Take 1 capsule (100 mg total) by mouth 2 (two) times daily.  Fish Oil 1000 MG Caps Take 2,000 mg by mouth daily.   HYDROcodone-acetaminophen 5-325 MG tablet Commonly known as: Norco Take 1 tablet by mouth every 4 (four) hours as needed for moderate pain.   isosorbide mononitrate 30 MG 24 hr tablet Commonly known as: IMDUR Take 30 mg by mouth every evening.   metoprolol succinate 25 MG 24 hr tablet Commonly known as: TOPROL-XL Take 25 mg by mouth daily.   nitroGLYCERIN 0.4 MG SL tablet Commonly known as: NITROSTAT Place 0.4 mg under the tongue every 5 (five) minutes as needed for chest pain.   ondansetron 4 MG tablet Commonly known as: Zofran Take 1 tablet (4 mg total) by mouth every 8  (eight) hours as needed for nausea or vomiting.   quinapril 20 MG tablet Commonly known as: ACCUPRIL Take 20 mg by mouth at bedtime.   senna 8.6 MG Tabs tablet Commonly known as: SENOKOT Take 2 tablets (17.2 mg total) by mouth at bedtime.   simvastatin 20 MG tablet Commonly known as: ZOCOR Take 20 mg by mouth every evening.   triamterene-hydrochlorothiazide 37.5-25 MG tablet Commonly known as: MAXZIDE-25 Take 1 tablet by mouth 4 (four) times a week.       Diagnostic Studies: DG C-Arm 1-60 Min-No Report  Result Date: 09/16/2019 Fluoroscopy was utilized by the requesting physician.  No radiographic interpretation.    Disposition: Discharge disposition: 01-Home or Self Care       Discharge Instructions    Call MD / Call 911   Complete by: As directed    If you experience chest pain or shortness of breath, CALL 911 and be transported to the hospital emergency room.  If you develope a fever above 101 F, pus (white drainage) or increased drainage or redness at the wound, or calf pain, call your surgeon's office.   Constipation Prevention   Complete by: As directed    Drink plenty of fluids.  Prune juice may be helpful.  You may use a stool softener, such as Colace (over the counter) 100 mg twice a day.  Use MiraLax (over the counter) for constipation as needed.   Diet - low sodium heart healthy   Complete by: As directed    Driving restrictions   Complete by: As directed    No driving for 6 weeks   Increase activity slowly as tolerated   Complete by: As directed    Lifting restrictions   Complete by: As directed    No lifting for 6 weeks   TED hose   Complete by: As directed    Use stockings (TED hose) for 2 weeks on both leg(s).  You may remove them at night for sleeping.      Follow-up Information    Wilford Merryfield, Aaron Edelman, MD. Schedule an appointment as soon as possible for a visit in 2 weeks.   Specialty: Orthopedic Surgery Why: For wound re-check Contact  information: [address] [place] [address] [id number]            Signed: Hilton Cork Bonnetta Allbee 09/16/2019, 9:43 AM

## 2019-09-16 NOTE — Op Note (Signed)
OPERATIVE REPORT  SURGEON: Rod Can, MD   ASSISTANT: Nehemiah Massed, PA-C.  PREOPERATIVE DIAGNOSIS: Left hip arthritis.   POSTOPERATIVE DIAGNOSIS: Left hip arthritis.   PROCEDURE: Left total hip arthroplasty, anterior approach.   IMPLANTS: DePuy Tri Lock stem, size 9, std offset. DePuy Pinnacle Cup, size 58 mm. DePuy Altrx liner, size 36 by 58 mm, neutral. DePuy Biolox ceramic head ball, size 36 + 5 mm.  ANESTHESIA:  MAC and Spinal  ESTIMATED BLOOD LOSS:-400 mL    ANTIBIOTICS: 2 g Ancef.  DRAINS: None.  COMPLICATIONS: None.   CONDITION: PACU - hemodynamically stable.   BRIEF CLINICAL NOTE: Dean Wilkins is a 74 y.o. male with a long-standing history of Left hip arthritis. After failing conservative management, the patient was indicated for total hip arthroplasty. The risks, benefits, and alternatives to the procedure were explained, and the patient elected to proceed.  PROCEDURE IN DETAIL: Surgical site was marked by myself in the pre-op holding area. Once inside the operating room, spinal anesthesia was obtained, and a foley catheter was inserted. The patient was then positioned on the Hana table.  All bony prominences were well padded.  The hip was prepped and draped in the normal sterile surgical fashion.  A time-out was called verifying side and site of surgery. The patient received IV antibiotics within 60 minutes of beginning the procedure.   The direct anterior approach to the hip was performed through the Hueter interval.  Lateral femoral circumflex vessels were treated with the Auqumantys. The anterior capsule was exposed and an inverted T capsulotomy was made. The femoral neck cut was made to the level of the templated cut.  A corkscrew was placed into the head and the head was removed.  The femoral head was found to have eburnated bone. The head was passed to the back table and was measured.   Acetabular exposure was achieved, and the pulvinar and labrum  were excised. Sequential reaming of the acetabulum was then performed up to a size 57 mm reamer. A 58 mm cup was then opened and impacted into place at approximately 40 degrees of abduction and 20 degrees of anteversion. The final polyethylene liner was impacted into place and acetabular osteophytes were removed.    I then gained femoral exposure taking care to protect the abductors and greater trochanter.  This was performed using standard external rotation, extension, and adduction.  The capsule was peeled off the inner aspect of the greater trochanter, taking care to preserve the short external rotators. A cookie cutter was used to enter the femoral canal, and then the femoral canal finder was placed.  Sequential broaching was performed up to a size 9.  Calcar planer was used on the femoral neck remnant.  I placed a std offset neck and a trial head ball.  The hip was reduced.  Leg lengths and offset were checked fluoroscopically.  The hip was dislocated and trial components were removed.  The final implants were placed, and the hip was reduced.  Fluoroscopy was used to confirm component position and leg lengths.  At 90 degrees of external rotation and full extension, the hip was stable to an anterior directed force. Lengthening of a few millimeters was required to achieve adequate soft tissue tension and stability.   The wound was copiously irrigated with Irrisept solution and normal saline using pule lavage.  Marcaine solution was injected into the periarticular soft tissue.  The wound was closed in layers using #1 Stratafix for the fascia, 2-0 Vicryl for  the subcutaneous fat, 2-0 Monocryl for the deep dermal layer, 3-0 running Monocryl subcuticular stitch, and Dermabond for the skin.  Once the glue was fully dried, an Aquacell Ag dressing was applied.  The patient was transported to the recovery room in stable condition.  Sponge, needle, and instrument counts were correct at the end of the case x2.  The  patient tolerated the procedure well and there were no known complications.  Please note that a surgical assistant was a medical necessity for this procedure to perform it in a safe and expeditious manner. Assistant was necessary to provide appropriate retraction of vital neurovascular structures, to prevent femoral fracture, and to allow for anatomic placement of the prosthesis.

## 2019-09-16 NOTE — Anesthesia Procedure Notes (Signed)
Spinal  Patient location during procedure: OR Start time: 09/16/2019 7:44 AM End time: 09/16/2019 7:48 AM Staffing Performed: anesthesiologist  Anesthesiologist: Audry Pili, MD Preanesthetic Checklist Completed: patient identified, IV checked, risks and benefits discussed, surgical consent, monitors and equipment checked, pre-op evaluation and timeout performed Spinal Block Patient position: sitting Prep: DuraPrep Patient monitoring: heart rate, cardiac monitor, continuous pulse ox and blood pressure Approach: midline Location: L3-4 Injection technique: single-shot Needle Needle type: Pencan  Needle gauge: 24 G Additional Notes Consent was obtained prior to the procedure with all questions answered and concerns addressed. Risks including, but not limited to, bleeding, infection, nerve damage, paralysis, failed block, inadequate analgesia, allergic reaction, high spinal, itching, and headache were discussed and the patient wished to proceed. Functioning IV was confirmed and monitors were applied. Sterile prep and drape, including hand hygiene, mask, and sterile gloves were used. The patient was positioned and the spine was prepped. The skin was anesthetized with lidocaine. Free flow of clear CSF was obtained prior to injecting local anesthetic into the CSF. The spinal needle aspirated freely following injection. The needle was carefully withdrawn. The patient tolerated the procedure well.   Renold Don, MD

## 2019-09-16 NOTE — Care Plan (Signed)
Ortho Bundle Case Management Note  Patient Details  Name: Byren Ast MRN: [id number] Date of Birth: 1946/03/02  L THA on 09-16-19 DCP:  Home with spouse.  1 story home with 2 ste. DME:  No needs.  Has a RW and doesn't need a 3-in-1. PT:  HEP                   DME Arranged:  N/A DME Agency:  NA  HH Arranged:  NA HH Agency:  NA  Additional Comments: Please contact me with any questions of if this plan should need to change.  Marianne Sofia, RN,CCM [facility]  [phone] 09/16/2019, 10:41 AM

## 2019-09-16 NOTE — Interval H&P Note (Signed)
History and Physical Interval Note:  09/16/2019 7:31 AM  Dean Wilkins  has presented today for surgery, with the diagnosis of degenerative joint disease left hip.  The various methods of treatment have been discussed with the patient and family. After consideration of risks, benefits and other options for treatment, the patient has consented to  Procedure(s): TOTAL HIP ARTHROPLASTY ANTERIOR APPROACH (Left) as a surgical intervention.  The patient's history has been reviewed, patient examined, no change in status, stable for surgery.  I have reviewed the patient's chart and labs.  Questions were answered to the patient's satisfaction.     [name]

## 2019-09-16 NOTE — Anesthesia Postprocedure Evaluation (Signed)
Anesthesia Post Note  Patient: Engineer, production  Procedure(s) Performed: TOTAL HIP ARTHROPLASTY ANTERIOR APPROACH (Left Hip)     Patient location during evaluation: PACU Anesthesia Type: Spinal Level of consciousness: awake and alert Pain management: pain level controlled Vital Signs Assessment: post-procedure vital signs reviewed and stable Respiratory status: spontaneous breathing and respiratory function stable Cardiovascular status: blood pressure returned to baseline and stable Postop Assessment: spinal receding and no apparent nausea or vomiting Anesthetic complications: no    Last Vitals:  Vitals:   09/16/19 1115 09/16/19 1133  BP:  117/85  Pulse: (!) 59 (!) 58  Resp: 19 18  Temp: 36.8 C (!) 36.3 C  SpO2: 96% 97%    Last Pain:  Vitals:   09/16/19 1145  TempSrc:   PainSc: [place] Flay Ghosh

## 2019-09-16 NOTE — Progress Notes (Signed)
Pt discharged in NAD,VSS, pain minimal to none. Wife at bedside as we reviewed discharge instructions. Discharge instructions given, all questions answered. Pt discharged with walker per request. Pt discharged by wheelchair home with wife. Pt discharged at 1555.

## 2019-09-16 NOTE — Evaluation (Signed)
Physical Therapy Evaluation Patient Details Name: Dean Wilkins MRN: [id number] DOB: Feb 02, 1946 Today's Date: 09/16/2019   History of Present Illness  Pt is 74 y.o. male s/p Lt THA anterior approach on 09/16/19 with PMH significant for HTN, HLD, PV's, CAD, aortic stenosis, Obesity, Lt heart cath, CABG 1999.    Clinical Impression  Dean Wilkins is a 74 y.o. male POD 0 s/p Lt THA anterior approach. Patient reports independence with mobility at baseline with occasional use of SPC in last 2 weeks due to pain. Patient is now limited by functional impairments (see PT problem list below) and requires supervision for transfers and gait with RW. Patient was able to ambulate 170 feet with RW and supervision and cues for safe walker management. Patient educated on safe sequencing for stair mobility and verbalized safe guarding position for people assisting with mobility. Patient instructed in exercises to facilitate ROM and circulation. Patient will benefit from continued skilled PT interventions to address impairments and progress towards PLOF. Patient has met mobility goals at adequate level for discharge home; will continue to follow if pt continues acute stay to progress towards Mod I goals.     Follow Up Recommendations Follow surgeon's recommendation for DC plan and follow-up therapies    Equipment Recommendations  Rolling walker with 5" wheels    Recommendations for Other Services       Precautions / Restrictions Precautions Precautions: Fall Restrictions Weight Bearing Restrictions: No      Mobility  Bed Mobility Overal bed mobility: Needs Assistance Bed Mobility: Supine to Sit;Sit to Supine     Supine to sit: Supervision;HOB elevated Sit to supine: Supervision;HOB elevated   General bed mobility comments: no assist required, pt able to raise/lower Lt LE without assist, demonstrated safe use of gait belt to assis with mobility  Transfers Overall transfer level: Needs  assistance Equipment used: Rolling walker (2 wheeled) Transfers: Sit to/from Stand Sit to Stand: Supervision         General transfer comment: cues for safe technique with RW, pt able to initiate power up and safely rise without assist  Ambulation/Gait Ambulation/Gait assistance: Supervision Gait Distance (Feet): 170 Feet Assistive device: Rolling walker (2 wheeled) Gait Pattern/deviations: Step-to pattern;Decreased stride length;Decreased stance time - left;Decreased step length - right Gait velocity: slow   General Gait Details: cues for sequencing safe step pattern with RW, pt maintained safe proximity to RW throughout, no overt LOB noted. Pt ambulated ~110 ft on first bout and ~60' on second bout to ambulate to bathroom. No cues required on second bout.  Stairs Stairs: Yes Stairs assistance: Supervision Stair Management: One rail Right;Forwards;Step to pattern Number of Stairs: 2 General stair comments: cues for safe step sequencing "up with good, down with bad"  Wheelchair Mobility    Modified Rankin (Stroke Patients Only)       Balance Overall balance assessment: Mild deficits observed, not formally tested            Pertinent Vitals/Pain Pain Assessment: No/denies pain    Home Living Family/patient expects to be discharged to:: Private residence Living Arrangements: Spouse/significant other Available Help at Discharge: Family;Available 24 hours/day(daughter works 9-4, son-in-law home  and can come help out as needed) Type of Home: House Home Access: Stairs to enter Entrance Stairs-Rails: Right Entrance Stairs-Number of Steps: 1 Home Layout: One level [place]: [place] - single point      Prior Function Level of Independence: Independent with assistive device(s)         Comments: using  walking stick for 10 days due to pain     Hand Dominance   Dominant Hand: Left    Extremity/Trunk Assessment   Upper Extremity Assessment Upper Extremity  Assessment: Overall WFL for tasks assessed    Lower Extremity Assessment Lower Extremity Assessment: Overall WFL for tasks assessed    Cervical / Trunk Assessment Cervical / Trunk Assessment: Normal  Communication   Communication: No difficulties  Cognition Arousal/Alertness: Awake/alert Behavior During Therapy: WFL for tasks assessed/performed Overall Cognitive Status: No family/caregiver present to determine baseline cognitive functioning    General Comments: Pt WFL's, slow at processing however able to recall all precautions, safety for gaurding, and sequencing/technique for functional mobility      General Comments General comments (skin integrity, edema, etc.): pt denies falls in last 6 months    Exercises Total Joint Exercises Ankle Circles/Pumps: AROM;Supine;Both;15 reps Quad Sets: AROM;5 reps;Supine;Left Short Arc Quad: AROM;5 reps;Supine;Left Heel Slides: AROM Hip ABduction/ADduction: AROM;5 reps;Supine;Left Long Arc Quad: AROM;5 reps;Seated;Left Other Exercises Other Exercises: Standing Exercises: educated on standing hip abduction and knee flexion (pt instructed not to perform marching per MD instructions)   Assessment/Plan    PT Assessment Patient needs continued PT services  PT Problem List Decreased strength;Decreased range of motion;Decreased activity tolerance;Decreased mobility;Decreased knowledge of use of DME;Decreased balance       PT Treatment Interventions DME instruction;Functional mobility training;Balance training;Patient/family education;Therapeutic activities;Gait training;Stair training;Therapeutic exercise    PT Goals (Current goals can be found in the Care Plan section)  Acute Rehab PT Goals Patient Stated Goal: go home tonight PT Goal Formulation: With patient Time For Goal Achievement: 09/23/19 Potential to Achieve Goals: Good    Frequency 7X/week    AM-PAC PT "6 Clicks" Mobility  Outcome Measure Help needed turning from your back  to your side while in a flat bed without using bedrails?: None Help needed moving from lying on your back to sitting on the side of a flat bed without using bedrails?: None Help needed moving to and from a bed to a chair (including a wheelchair)?: A Little Help needed standing up from a chair using your arms (e.g., wheelchair or bedside chair)?: A Little Help needed to walk in hospital room?: A Little Help needed climbing 3-5 steps with a railing? : A Little 6 Click Score: 20    End of Session Equipment Utilized During Treatment: Gait belt Activity Tolerance: Patient tolerated treatment well Patient left: in bed;with call bell/phone within reach Nurse Communication: Mobility status PT Visit Diagnosis: Muscle weakness (generalized) (M62.81);Difficulty in walking, not elsewhere classified (R26.2)    Time: 5686-1683 PT Time Calculation (min) (ACUTE ONLY): 60 min   Charges:   PT Evaluation $PT Eval Low Complexity: 1 Low PT Treatments $Gait Training: 23-37 mins $Therapeutic Exercise: 8-22 mins       Verner Mould, DPT Physical Therapist with [facility] [phone]  09/16/2019 4:44 PM

## 2019-09-17 ENCOUNTER — Encounter: Payer: Self-pay | Admitting: *Deleted

## 2019-09-17 LAB — TYPE AND SCREEN
ABO/RH(D): O POS
Antibody Screen: NEGATIVE

## 2019-10-04 DIAGNOSIS — Z96642 Presence of left artificial hip joint: Secondary | ICD-10-CM | POA: Diagnosis not present

## 2019-10-04 DIAGNOSIS — Z471 Aftercare following joint replacement surgery: Secondary | ICD-10-CM | POA: Diagnosis not present

## 2019-10-28 DIAGNOSIS — Z23 Encounter for immunization: Secondary | ICD-10-CM | POA: Diagnosis not present

## 2019-11-01 DIAGNOSIS — Z471 Aftercare following joint replacement surgery: Secondary | ICD-10-CM | POA: Diagnosis not present

## 2019-11-01 DIAGNOSIS — Z96642 Presence of left artificial hip joint: Secondary | ICD-10-CM | POA: Diagnosis not present

## 2019-11-08 DIAGNOSIS — G4733 Obstructive sleep apnea (adult) (pediatric): Secondary | ICD-10-CM | POA: Diagnosis not present

## 2019-11-18 DIAGNOSIS — Z23 Encounter for immunization: Secondary | ICD-10-CM | POA: Diagnosis not present

## 2019-12-22 DIAGNOSIS — G4733 Obstructive sleep apnea (adult) (pediatric): Secondary | ICD-10-CM | POA: Diagnosis not present

## 2019-12-30 DIAGNOSIS — L570 Actinic keratosis: Secondary | ICD-10-CM | POA: Diagnosis not present

## 2020-03-01 ENCOUNTER — Ambulatory Visit: Payer: Medicare Other | Admitting: Cardiology

## 2020-03-01 DIAGNOSIS — Z0000 Encounter for general adult medical examination without abnormal findings: Secondary | ICD-10-CM | POA: Diagnosis not present

## 2020-03-01 DIAGNOSIS — E785 Hyperlipidemia, unspecified: Secondary | ICD-10-CM | POA: Diagnosis not present

## 2020-03-01 DIAGNOSIS — I2510 Atherosclerotic heart disease of native coronary artery without angina pectoris: Secondary | ICD-10-CM | POA: Diagnosis not present

## 2020-03-01 DIAGNOSIS — I10 Essential (primary) hypertension: Secondary | ICD-10-CM | POA: Diagnosis not present

## 2020-03-01 DIAGNOSIS — D6949 Other primary thrombocytopenia: Secondary | ICD-10-CM | POA: Diagnosis not present

## 2020-03-08 ENCOUNTER — Other Ambulatory Visit: Payer: Self-pay

## 2020-03-08 ENCOUNTER — Encounter: Payer: Self-pay | Admitting: Cardiology

## 2020-03-08 ENCOUNTER — Ambulatory Visit: Payer: Medicare Other | Admitting: Cardiology

## 2020-03-08 VITALS — BP 128/83 | HR 58 | Resp 17 | Ht 73.0 in | Wt 243.0 lb

## 2020-03-08 DIAGNOSIS — I2510 Atherosclerotic heart disease of native coronary artery without angina pectoris: Secondary | ICD-10-CM

## 2020-03-08 DIAGNOSIS — Z951 Presence of aortocoronary bypass graft: Secondary | ICD-10-CM | POA: Diagnosis not present

## 2020-03-08 DIAGNOSIS — E7800 Pure hypercholesterolemia, unspecified: Secondary | ICD-10-CM

## 2020-03-08 DIAGNOSIS — Z9989 Dependence on other enabling machines and devices: Secondary | ICD-10-CM

## 2020-03-08 DIAGNOSIS — Z87891 Personal history of nicotine dependence: Secondary | ICD-10-CM | POA: Diagnosis not present

## 2020-03-08 DIAGNOSIS — I10 Essential (primary) hypertension: Secondary | ICD-10-CM | POA: Diagnosis not present

## 2020-03-08 DIAGNOSIS — G4733 Obstructive sleep apnea (adult) (pediatric): Secondary | ICD-10-CM | POA: Diagnosis not present

## 2020-03-08 MED ORDER — METOPROLOL SUCCINATE ER 25 MG PO TB24: 25.0000 mg | ORAL_TABLET | Freq: Every morning | ORAL | 3 refills | Status: DC

## 2020-03-08 MED ORDER — QUINAPRIL HCL 20 MG PO TABS: 20.0000 mg | ORAL_TABLET | Freq: Every day | ORAL | 3 refills | Status: DC

## 2020-03-08 NOTE — Progress Notes (Signed)
Dean Wilkins Date of Birth: [birth year] MRN: [id number] Primary Care Provider:Baker, Shelly Flatten, MD Former Cardiology Providers: Jeri Lager, APRN, FNP-C Primary Cardiologist: Rex Kras, DO, [facility] (established care 03/08/2020)  Date: 03/08/20 Last Visit: 03/02/2019  Chief Complaint  Patient presents with  . Coronary Artery Disease  . Hypertension  . Follow-up    1 year    HPI  Dean Wilkins is a 74 y.o.  male who presents to the office with a chief complaint of " 1 year follow-up for CAD management." Patient's past medical history and cardiovascular risk factors include: Hypertension, established coronary disease prior CABG in 1999, patent with LIMA to LAD, PCI to RCA, severe OM1 stenosis, mildly reduced LVEF 45%, OSA on CPAP, Hx of SVT.   Patient presents today for his 1 year follow-up for management of coronary disease with prior history of bypass surgery.  Patient was originally under the care of Jeri Lager, APRN, FNP-C and I am seeing him for the first time for the above-mentioned chief complaint.  Since last office visit patient states that he is doing well from a cardiovascular standpoint.  No recent hospitalizations or urgent care visits for cardiovascular symptoms.  In February 2021 patient did undergo hip replacement without any complications.  He continues to work as a Development worker, community.  He exercises at least 4 times a week with blocks 1 mile each time.  He does not have any neck related symptoms or decrease in endurance.  Patient is compliant with his medical therapy.  His labs lipids are currently being followed by his primary care provider.  FUNCTIONAL STATUS: Walks about 4 times a week for atleast a mile.    ALLERGIES: Allergies  Allergen Reactions  . Etodolac Other (See Comments)    Unknown allergic reaction.  . Lipitor [Atorvastatin] Other (See Comments)    Muscle aches/leg pains.  . Niaspan [Niacin Er] Other (See Comments)    Patient cannot remember what the  reaction was.    MEDICATION LIST PRIOR TO VISIT: Current Outpatient Medications on File Prior to Visit  Medication Sig Dispense Refill  . aspirin EC 81 MG tablet Take 81 mg by mouth daily. Swallow whole.    . isosorbide mononitrate (IMDUR) 30 MG 24 hr tablet Take 30 mg by mouth every evening.     . nitroGLYCERIN (NITROSTAT) 0.4 MG SL tablet Place 0.4 mg under the tongue every 5 (five) minutes as needed for chest pain.     . Omega-3 Fatty Acids (FISH OIL) 1000 MG CAPS Take 2,000 mg by mouth daily.     . simvastatin (ZOCOR) 40 MG tablet Take 20 mg by mouth every evening.     . triamterene-hydrochlorothiazide (MAXZIDE-25) 37.5-25 MG tablet Take 1 tablet by mouth 4 (four) times a week.     . Wheat Dextrin (BENEFIBER DRINK MIX PO) Take 1 Scoop by mouth daily. (Patient not taking: Reported on 03/08/2020)     No current facility-administered medications on file prior to visit.   PAST MEDICAL HISTORY: Past Medical History:  Diagnosis Date  . Aortic sclerosis   . Atherosclerosis of native coronary artery of native heart with angina pectoris ([place]) 03/02/2019  . Coronary artery disease   . Dysrhythmia    PVC's, V-tach  . Exogenous obesity   . Heart murmur   . History of coronary artery bypass graft x 3 03/02/2019  . History of PTCA 03/02/2019  . HLD (hyperlipidemia) 03/02/2019  . HTN (hypertension) 03/02/2019  . Hyperlipidemia   . Hypertension   .  Left ventricular hypertrophy   . NSVT (nonsustained ventricular tachycardia) ([place]) 03/02/2019  . PVC (premature ventricular contraction) 03/02/2019    PAST SURGICAL HISTORY: Past Surgical History:  Procedure Laterality Date  . CORONARY ARTERY BYPASS GRAFT  1999   DES stent to mid native RCA /DES stent to mild RCA  2008/2009  . EYE SURGERY  2019,2020   bilateral cataract surgery with lens implant  . LEFT HEART CATH AND CORS/GRAFTS ANGIOGRAPHY N/A 05/27/2017   Procedure: LEFT HEART CATH AND CORS/GRAFTS ANGIOGRAPHY;  Surgeon: Adrian Prows, MD;   Location: [facility];  Service: Cardiovascular;  Laterality: N/A;  . NM MYOVIEW LTD     2013  . TONSILLECTOMY    . TOTAL HIP ARTHROPLASTY Left 09/16/2019   Procedure: TOTAL HIP ARTHROPLASTY ANTERIOR APPROACH;  Surgeon: Rod Can, MD;  Location: [facility];  Service: Orthopedics;  Laterality: Left;  . TOTAL HIP ARTHROPLASTY      FAMILY HISTORY: The patient's family history includes Heart attack in his brother.   SOCIAL HISTORY:  The patient  reports that he quit smoking about 51 years ago. His smoking use included cigarettes. He has a 0.25 pack-year smoking history. He has never used smokeless tobacco. He reports previous alcohol use. He reports that he does not use drugs.  Review of Systems  Constitutional: Negative for chills and fever.  HENT: Negative for hoarse voice and nosebleeds.   Eyes: Negative for discharge, double vision and pain.  Cardiovascular: Negative for chest pain, claudication, dyspnea on exertion, leg swelling, near-syncope, orthopnea, palpitations, paroxysmal nocturnal dyspnea and syncope.  Respiratory: Negative for hemoptysis and shortness of breath.   Musculoskeletal: Negative for muscle cramps and myalgias.  Gastrointestinal: Negative for abdominal pain, constipation, diarrhea, hematemesis, hematochezia, melena, nausea and vomiting.  Neurological: Negative for dizziness and light-headedness.   PHYSICAL EXAM: Vitals with BMI 03/08/2020 09/16/2019 09/16/2019  Height 6\' 1"  - -  Weight 243 lbs - -  BMI 00.86 - -  Systolic 761 950 932  Diastolic 83 99 81  Pulse 58 - 69   CONSTITUTIONAL: Well-developed and well-nourished. No acute distress.  SKIN: Skin is warm and dry. No rash noted. No cyanosis. No pallor. No jaundice HEAD: Normocephalic and atraumatic.  EYES: No scleral icterus  MOUTH/THROAT: Moist oral membranes.  NECK: No JVD present. No thyromegaly noted. No carotid bruits  LYMPHATIC: No visible cervical adenopathy.  CHEST Normal respiratory  effort. No intercostal retractions  LUNGS: Clear to auscultation bilaterally.  No stridor. No wheezes. No rales.  CARDIOVASCULAR: Regular rate and rhythm, positive I7-T2, soft holosystolic murmur heard at the apex, no gallops or rubs. ABDOMINAL: Obese, soft, nontender, nondistended, positive bowel sounds in all 4 quadrants.  No apparent ascites.  EXTREMITIES: No peripheral edema  HEMATOLOGIC: No significant bruising NEUROLOGIC: Oriented to person, place, and time. Nonfocal. Normal muscle tone.  PSYCHIATRIC: Normal mood and affect. Normal behavior. Cooperative  CARDIAC DATABASE: EKG: 03/08/2020: Normal sinus rhythm, 69 bpm, poor R wave progression, old inferior infarct, consider old anterior infarct, occasional PAC, without underlying injury pattern.  Echocardiogram: [04/30/2017]: Left ventricle cavity is minimally dilated at 5.3 cm and volume of 125mL. Mild concentric hypertrophy of the left ventricle. Doppler evidence of grade I (impaired) diastolic dysfunction. Left ventricle regional wall motion findings: mid and apical inferior, inferolateral akinesis. LV systolic function is mild to moderately reduced. Visual EF is 40-45%. Left atrial cavity is severely dilated at 5.1 cm. Mild (Grade I) aortic regurgitation. Mild (Grade I) mitral regurgitation. Mild tricuspid regurgitation. No evidence  of pulmonary hypertension.  Stress Testing:  Nuclear stress test [04/25/2017]:  1. Prognostically, this is a moderate risk study. 2. Resting EKG shows NSR, frequent PVC. Stress EKG no change. V- Bigeminy and Quadragemini persisted. No ST-T changes. 3. The gated study showed the ejection fraction was 40-45%. There is lateral wall akinesis. There is a moderate-sized scar in the lateral wall extending from the mid ventricle towards the apex including the apical anterior wall. In addition there is very mild peri-infarct ischemia mostly towards mid anterolateral wall  Heart Catheterization: 05/27/2017:  Stents placed 10/25/2008 in proximal RCA with a 2.5 x 23 mm Xience DES 30-40% ISR, History of mid RCA Cypher stent and mid to distal Liberte stent placed remotely patent. Prox Cx 40% diffuse, OM-1 95%, LAD flush occluded with patent LIMA to LAD. CABG 1999. Occluded SVG to OM and RIMA to RCA old.  Abdominal Ultrasound [03/21/2015]: No AAA observed. Mild atheresclerosis.  LABORATORY DATA: CBC Latest Ref Rng & Units 09/14/2019 10/26/2008 10/25/2008  WBC 4.0 - 10.5 K/uL 8.0 5.2 5.1  Hemoglobin 13.0 - 17.0 g/dL 16.2 15.1 14.9  Hematocrit 39 - 52 % 47.5 43.7 42.7  Platelets 150 - 400 K/uL 133(L) 103(L) 114(L)    CMP Latest Ref Rng & Units 09/14/2019 10/26/2008 10/25/2008  Glucose 70 - 99 mg/dL 90 87 91  BUN 8 - 23 mg/dL 15 7 11   Creatinine 0.61 - 1.24 mg/dL 0.91 0.95 0.97  Sodium 135 - 145 mmol/L 136 138 137  Potassium 3.5 - 5.1 mmol/L 4.1 3.7 3.6  Chloride 98 - 111 mmol/L 100 109 108  CO2 22 - 32 mmol/L 26 25 26   Calcium 8.9 - 10.3 mg/dL 9.2 8.8 9.0  Total Protein 6.5 - 8.1 g/dL 7.2 - -  Total Bilirubin 0.3 - 1.2 mg/dL 1.0 - -  Alkaline Phos 38 - 126 U/L 60 - -  AST 15 - 41 U/L 23 - -  ALT 0 - 44 U/L 20 - -    Lipid Panel     Component Value Date/Time   CHOL  10/25/2008 0430    114        ATP III CLASSIFICATION:  <200     mg/dL   Desirable  200-239  mg/dL   Borderline High  >=240    mg/dL   High          TRIG 46 10/25/2008 0430   HDL 35 (L) 10/25/2008 0430   CHOLHDL 3.3 10/25/2008 0430   VLDL 9 10/25/2008 0430   LDLCALC  10/25/2008 0430    70        Total Cholesterol/HDL:CHD Risk Coronary Heart Disease Risk Table                     Men   Women  1/2 Average Risk   3.4   3.3  Average Risk       5.0   4.4  2 X Average Risk   9.6   7.1  3 X Average Risk  23.4   11.0        Use the calculated Patient Ratio above and the CHD Risk Table to determine the patient's CHD Risk.        ATP III CLASSIFICATION (LDL):  <100     mg/dL   Optimal  100-129  mg/dL   Near or Above                     Optimal  130-159  mg/dL   Borderline  160-189  mg/dL   High  >190     mg/dL   Very High    Lab Results  Component Value Date   HGBA1C  10/24/2008    4.8 (NOTE)   The ADA recommends the following therapeutic goal for glycemic   control related to Hgb A1C measurement:   Goal of Therapy:   < 7.0% Hgb A1C   Reference: American Diabetes Association: Clinical Practice   Recommendations 2008, Diabetes Care,  2008, 31:(Suppl 1).   No components found for: NTPROBNP  Cardiac Panel (last 3 results) No results for input(s): CKTOTAL, CKMB, TROPONINIHS, RELINDX in the last 72 hours.  IMPRESSION:    ICD-10-CM   1. Atherosclerosis of native coronary artery of native heart without angina pectoris  I25.10 EKG 12-Lead    metoprolol succinate (TOPROL-XL) 25 MG 24 hr tablet    quinapril (ACCUPRIL) 20 MG tablet    PCV ECHOCARDIOGRAM COMPLETE  2. History of coronary artery bypass graft x 3  Z95.1 metoprolol succinate (TOPROL-XL) 25 MG 24 hr tablet    quinapril (ACCUPRIL) 20 MG tablet    PCV ECHOCARDIOGRAM COMPLETE  3. Essential hypertension  I10 quinapril (ACCUPRIL) 20 MG tablet  4. Obstructive sleep apnea on CPAP  G47.33    Z99.89   5. Pure hypercholesterolemia  E78.00   6. Former smoker  Z87.891      RECOMMENDATIONS: Dean Wilkins is a 74 y.o. male whose past medical history and cardiovascular risk factors include: Hypertension, established coronary disease prior CABG in 1999, patent with LIMA to LAD, PCI to RCA, severe OM1 stenosis, mildly reduced LVEF 45%, OSA on CPAP, Hx of SVT.   Established coronary artery disease with prior coronary interventions and bypass surgery without angina pectoris: Medications reconciled. Patient is here for a 1 year follow-up and overall is doing well from a cardiovascular standpoint. He continues to be physically active and continues to also work without any effort related symptoms or anginal equivalent. Will obtain records from his PCPs office in regards  to his most recent lipid profile and blood work. Metoprolol and quinapril refilled. Plan for an echocardiogram prior to the next office visit. 1 year follow-up recommended.  Benign essential hypertension: Patient's office blood pressure is very well controlled.  Educated on importance of a low-salt diet.  Currently managed by primary care provider.  Hyperlipidemia: Continue statin therapy.  Currently managed by primary care provider.  FINAL MEDICATION LIST END OF ENCOUNTER: Meds ordered this encounter  Medications  . metoprolol succinate (TOPROL-XL) 25 MG 24 hr tablet    Sig: Take 1 tablet (25 mg total) by mouth in the morning.    Dispense:  90 tablet    Refill:  3  . quinapril (ACCUPRIL) 20 MG tablet    Sig: Take 1 tablet (20 mg total) by mouth at bedtime.    Dispense:  90 tablet    Refill:  3    Current Outpatient Medications:  .  aspirin EC 81 MG tablet, Take 81 mg by mouth daily. Swallow whole., Disp: , Rfl:  .  isosorbide mononitrate (IMDUR) 30 MG 24 hr tablet, Take 30 mg by mouth every evening. , Disp: , Rfl:  .  metoprolol succinate (TOPROL-XL) 25 MG 24 hr tablet, Take 1 tablet (25 mg total) by mouth in the morning., Disp: 90 tablet, Rfl: 3 .  nitroGLYCERIN (NITROSTAT) 0.4 MG SL tablet, Place 0.4 mg under the tongue every 5 (five) minutes as needed for chest pain. ,  Disp: , Rfl:  .  Omega-3 Fatty Acids (FISH OIL) 1000 MG CAPS, Take 2,000 mg by mouth daily. , Disp: , Rfl:  .  quinapril (ACCUPRIL) 20 MG tablet, Take 1 tablet (20 mg total) by mouth at bedtime., Disp: 90 tablet, Rfl: 3 .  simvastatin (ZOCOR) 40 MG tablet, Take 20 mg by mouth every evening. , Disp: , Rfl:  .  triamterene-hydrochlorothiazide (MAXZIDE-25) 37.5-25 MG tablet, Take 1 tablet by mouth 4 (four) times a week. , Disp: , Rfl:  .  Wheat Dextrin (BENEFIBER DRINK MIX PO), Take 1 Scoop by mouth daily. (Patient not taking: Reported on 03/08/2020), Disp: , Rfl:   Orders Placed This Encounter  Procedures  . EKG  12-Lead  . PCV ECHOCARDIOGRAM COMPLETE   --Continue cardiac medications as reconciled in final medication list. --Return in about 1 year (around 03/08/2021) for CAD follow up . Or sooner if needed. --Continue follow-up with your primary care physician regarding the management of your other chronic comorbid conditions.  Patient's questions and concerns were addressed to his satisfaction. He voices understanding of the instructions provided during this encounter.   During this visit I reviewed and updated: Tobacco history  allergies medication reconciliation  medical history  surgical history  family history  social history.  This note was created using a voice recognition software as a result there may be grammatical errors inadvertently enclosed that do not reflect the nature of this encounter. Every attempt is made to correct such errors.  Rex Kras, [place], [facility]  Pager: [phone] Office: [phone]

## 2020-03-13 DIAGNOSIS — Z96642 Presence of left artificial hip joint: Secondary | ICD-10-CM | POA: Diagnosis not present

## 2020-03-14 DIAGNOSIS — Z0000 Encounter for general adult medical examination without abnormal findings: Secondary | ICD-10-CM | POA: Diagnosis not present

## 2020-03-14 DIAGNOSIS — F17210 Nicotine dependence, cigarettes, uncomplicated: Secondary | ICD-10-CM | POA: Diagnosis not present

## 2020-03-14 DIAGNOSIS — I2510 Atherosclerotic heart disease of native coronary artery without angina pectoris: Secondary | ICD-10-CM | POA: Diagnosis not present

## 2020-03-14 DIAGNOSIS — Z6832 Body mass index (BMI) 32.0-32.9, adult: Secondary | ICD-10-CM | POA: Diagnosis not present

## 2020-03-14 DIAGNOSIS — D6949 Other primary thrombocytopenia: Secondary | ICD-10-CM | POA: Diagnosis not present

## 2020-03-14 DIAGNOSIS — E785 Hyperlipidemia, unspecified: Secondary | ICD-10-CM | POA: Diagnosis not present

## 2020-03-14 DIAGNOSIS — I10 Essential (primary) hypertension: Secondary | ICD-10-CM | POA: Diagnosis not present

## 2020-05-19 DIAGNOSIS — Z23 Encounter for immunization: Secondary | ICD-10-CM | POA: Diagnosis not present

## 2020-05-30 DIAGNOSIS — G4733 Obstructive sleep apnea (adult) (pediatric): Secondary | ICD-10-CM | POA: Diagnosis not present

## 2020-06-06 DIAGNOSIS — M545 Low back pain, unspecified: Secondary | ICD-10-CM | POA: Diagnosis not present

## 2020-06-06 DIAGNOSIS — M5416 Radiculopathy, lumbar region: Secondary | ICD-10-CM | POA: Diagnosis not present

## 2020-06-13 DIAGNOSIS — M5116 Intervertebral disc disorders with radiculopathy, lumbar region: Secondary | ICD-10-CM | POA: Diagnosis not present

## 2020-06-13 DIAGNOSIS — Q7649 Other congenital malformations of spine, not associated with scoliosis: Secondary | ICD-10-CM | POA: Diagnosis not present

## 2020-06-13 DIAGNOSIS — M4727 Other spondylosis with radiculopathy, lumbosacral region: Secondary | ICD-10-CM | POA: Diagnosis not present

## 2020-06-13 DIAGNOSIS — M5117 Intervertebral disc disorders with radiculopathy, lumbosacral region: Secondary | ICD-10-CM | POA: Diagnosis not present

## 2020-06-23 DIAGNOSIS — M5416 Radiculopathy, lumbar region: Secondary | ICD-10-CM | POA: Diagnosis not present

## 2020-06-23 DIAGNOSIS — M545 Low back pain, unspecified: Secondary | ICD-10-CM | POA: Diagnosis not present

## 2020-07-04 DIAGNOSIS — M47816 Spondylosis without myelopathy or radiculopathy, lumbar region: Secondary | ICD-10-CM | POA: Diagnosis not present

## 2020-07-04 DIAGNOSIS — M5416 Radiculopathy, lumbar region: Secondary | ICD-10-CM | POA: Diagnosis not present

## 2021-02-23 ENCOUNTER — Ambulatory Visit: Payer: Medicare Other

## 2021-02-23 ENCOUNTER — Other Ambulatory Visit: Payer: Self-pay

## 2021-02-23 DIAGNOSIS — Z951 Presence of aortocoronary bypass graft: Secondary | ICD-10-CM

## 2021-02-23 DIAGNOSIS — I2510 Atherosclerotic heart disease of native coronary artery without angina pectoris: Secondary | ICD-10-CM

## 2021-03-02 NOTE — Progress Notes (Signed)
Spoke with patient wife and she will let him know as soon as she gets home.

## 2021-03-09 ENCOUNTER — Ambulatory Visit: Payer: Medicare Other | Admitting: Cardiology

## 2021-03-12 ENCOUNTER — Other Ambulatory Visit: Payer: Self-pay

## 2021-03-12 ENCOUNTER — Encounter: Payer: Self-pay | Admitting: Cardiology

## 2021-03-12 ENCOUNTER — Ambulatory Visit: Payer: Medicare Other | Admitting: Cardiology

## 2021-03-12 VITALS — BP 130/82 | HR 65 | Temp 97.8°F | Resp 16 | Ht 73.0 in | Wt 240.2 lb

## 2021-03-12 DIAGNOSIS — G4733 Obstructive sleep apnea (adult) (pediatric): Secondary | ICD-10-CM

## 2021-03-12 DIAGNOSIS — Z87891 Personal history of nicotine dependence: Secondary | ICD-10-CM

## 2021-03-12 DIAGNOSIS — I2510 Atherosclerotic heart disease of native coronary artery without angina pectoris: Secondary | ICD-10-CM

## 2021-03-12 DIAGNOSIS — Z9989 Dependence on other enabling machines and devices: Secondary | ICD-10-CM

## 2021-03-12 DIAGNOSIS — E7800 Pure hypercholesterolemia, unspecified: Secondary | ICD-10-CM

## 2021-03-12 DIAGNOSIS — I10 Essential (primary) hypertension: Secondary | ICD-10-CM

## 2021-03-12 DIAGNOSIS — Z951 Presence of aortocoronary bypass graft: Secondary | ICD-10-CM

## 2021-03-12 MED ORDER — METOPROLOL SUCCINATE ER 25 MG PO TB24: 25.0000 mg | ORAL_TABLET | Freq: Every morning | ORAL | 3 refills | Status: DC

## 2021-03-12 NOTE — Progress Notes (Signed)
Dean Wilkins Date of Birth: 07-11-1946 MRN: [id number] Primary Care Provider:Baker, Shelly Flatten, MD Former Cardiology Providers: Jeri Lager, APRN, FNP-C Primary Cardiologist: Rex Kras, DO, [facility] (established care 03/08/2020)  Date: 03/12/21 Last Office Visit: 03/08/2020   Chief Complaint  Patient presents with   Atherosclerosis of native coronary artery of native heart w   Follow-up   HPI  Dean Wilkins is a 75 y.o.  male who presents to the office with a chief complaint of " annual follow-up for CAD management." Patient's past medical history and cardiovascular risk factors include: Hypertension, established coronary disease prior CABG in 1999 (LIMA to LAD, SVG to OM, SVG to RCA), hyperlipidemia, OSA on CPAP, Hx of SVT, advanced age.  Patient presents today for his 1 year follow-up for management of coronary disease with prior history of bypass surgery.  Patient is accompanied by his wife at today's office visit.  Over the last 1 year patient is doing well from a cardiovascular standpoint and denies any chest pain or shortness of breath at rest or with effort related activities.  No hospitalizations or urgent care visits for cardiovascular symptoms.  Over the last 1 year patient has developed sciatica and therefore his functional status is overall reduced.  He is no longer walking 1 mile 4 times a week.  He also recently had labs with his PCP I do not have them to review during today's encounter.  He has had an echocardiogram since last office visit which notes improvement in LVEF from 40-45% to 55-60% with mild valvular heart disease.  His labs lipids are currently being followed by his primary care provider.  FUNCTIONAL STATUS: No structured exercise program or daily routine.  Tries to keep himself active but is limited due to sciatica involving the right side.  ALLERGIES: Allergies  Allergen Reactions   Etodolac Other (See Comments)    Unknown allergic reaction.   Lipitor  [Atorvastatin] Other (See Comments)    Muscle aches/leg pains.   Niaspan [Niacin Er] Other (See Comments)    Patient cannot remember what the reaction was.    MEDICATION LIST PRIOR TO VISIT: Current Outpatient Medications on File Prior to Visit  Medication Sig Dispense Refill   aspirin EC 81 MG tablet Take 81 mg by mouth daily. Swallow whole.     isosorbide mononitrate (IMDUR) 30 MG 24 hr tablet Take 30 mg by mouth every evening.      nitroGLYCERIN (NITROSTAT) 0.4 MG SL tablet Place 0.4 mg under the tongue every 5 (five) minutes as needed for chest pain.      Omega-3 Fatty Acids (FISH OIL) 1000 MG CAPS Take 2,000 mg by mouth daily.      quinapril (ACCUPRIL) 20 MG tablet Take 1 tablet (20 mg total) by mouth at bedtime. 90 tablet 3   simvastatin (ZOCOR) 40 MG tablet Take 20 mg by mouth every evening.      triamterene-hydrochlorothiazide (MAXZIDE-25) 37.5-25 MG tablet Take 1 tablet by mouth 4 (four) times a week.      No current facility-administered medications on file prior to visit.   PAST MEDICAL HISTORY: Past Medical History:  Diagnosis Date   Aortic sclerosis    Atherosclerosis of native coronary artery of native heart with angina pectoris ([place]) 03/02/2019   Coronary artery disease    Dysrhythmia    PVC's, V-tach   Exogenous obesity    Heart murmur    History of coronary artery bypass graft x 3 03/02/2019   History of PTCA 03/02/2019  HLD (hyperlipidemia) 03/02/2019   HTN (hypertension) 03/02/2019   Hyperlipidemia    Hypertension    Left ventricular hypertrophy    NSVT (nonsustained ventricular tachycardia) ([place]) 03/02/2019   PVC (premature ventricular contraction) 03/02/2019    PAST SURGICAL HISTORY: Past Surgical History:  Procedure Laterality Date   CORONARY ARTERY BYPASS GRAFT  1999   DES stent to mid native RCA /DES stent to mild RCA  2008/2009   EYE SURGERY  2019,2020   bilateral cataract surgery with lens implant   LEFT HEART CATH AND CORS/GRAFTS ANGIOGRAPHY N/A  05/27/2017   Procedure: LEFT HEART CATH AND CORS/GRAFTS ANGIOGRAPHY;  Surgeon: Adrian Prows, MD;  Location: [facility];  Service: Cardiovascular;  Laterality: N/A;   NM MYOVIEW LTD     2013   TONSILLECTOMY     TOTAL HIP ARTHROPLASTY Left 09/16/2019   Procedure: TOTAL HIP ARTHROPLASTY ANTERIOR APPROACH;  Surgeon: Rod Can, MD;  Location: [facility];  Service: Orthopedics;  Laterality: Left;   TOTAL HIP ARTHROPLASTY      FAMILY HISTORY: The patient's family history includes Heart attack in his brother.   SOCIAL HISTORY:  The patient  reports that he quit smoking about 52 years ago. His smoking use included cigarettes. He has a 0.25 pack-year smoking history. He has never used smokeless tobacco. He reports that he does not currently use alcohol. He reports that he does not use drugs.  Review of Systems  Constitutional: Negative for chills and fever.  HENT:  Negative for hoarse voice and nosebleeds.   Eyes:  Negative for discharge, double vision and pain.  Cardiovascular:  Negative for chest pain, claudication, dyspnea on exertion, leg swelling, near-syncope, orthopnea, palpitations, paroxysmal nocturnal dyspnea and syncope.  Respiratory:  Negative for hemoptysis and shortness of breath.   Musculoskeletal:  Negative for muscle cramps and myalgias.  Gastrointestinal:  Negative for abdominal pain, constipation, diarrhea, hematemesis, hematochezia, melena, nausea and vomiting.  Neurological:  Negative for dizziness and light-headedness.   PHYSICAL EXAM: Vitals with BMI 03/12/2021 03/08/2020 09/16/2019  Height '6\' 1"'$  '6\' 1"'$  -  Weight 240 lbs 3 oz 243 lbs -  BMI [id number] -  Systolic [id number] 0000000 [id number]  Diastolic 82 83 99  Pulse 65 58 -   CONSTITUTIONAL: Well-developed and well-nourished. No acute distress.  SKIN: Skin is warm and dry. No rash noted. No cyanosis. No pallor. No jaundice HEAD: Normocephalic and atraumatic.  EYES: No scleral icterus  MOUTH/THROAT: Moist oral membranes.  NECK:  No JVD present. No thyromegaly noted. No carotid bruits  LYMPHATIC: No visible cervical adenopathy.  CHEST Normal respiratory effort. No intercostal retractions  LUNGS: Clear to auscultation bilaterally.  No stridor. No wheezes. No rales.  CARDIOVASCULAR: Regular rate and rhythm, positive [id number], soft holosystolic murmur heard at the apex, no gallops or rubs. ABDOMINAL: Obese, soft, nontender, nondistended, positive bowel sounds in all 4 quadrants.  No apparent ascites.  EXTREMITIES: No peripheral edema, warm to touch bilaterally, palpable DP and PT pulses bilaterally. HEMATOLOGIC: No significant bruising NEUROLOGIC: Oriented to person, place, and time. Nonfocal. Normal muscle tone.  PSYCHIATRIC: Normal mood and affect. Normal behavior. Cooperative No significant change in physical examination since last office encounter.  CARDIAC DATABASE: EKG: 03/12/2021: Normal sinus rhythm, 67 bpm, nonspecific T wave abnormality, without underlying ischemia or injury pattern.    Echocardiogram: 04/30/2017: LVEF 40-45%.    02/23/2021: Normal LV systolic function with visual EF 55-60%. Left ventricle cavity is normal in size. Mild left ventricular hypertrophy. Normal global wall motion.  Abnormal septal wall motion due to post-op CABG. Normal diastolic filling pattern, normal LAP. Mild (Grade I) aortic regurgitation. Mild (Grade I) mitral regurgitation. Mild tricuspid regurgitation. No evidence of pulmonary hypertension. Mild to moderate pulmonic regurgitation.  Stress Testing:  Nuclear stress test  [04/25/2017]:  1. Prognostically, this is a moderate risk study. 2. Resting EKG shows NSR, frequent PVC. Stress EKG no change. V- Bigeminy and Quadragemini persisted. No ST-T changes. 3. The gated study showed the ejection fraction was 40-45%. There is lateral wall akinesis. There is a moderate-sized scar in the lateral wall extending from the mid ventricle towards the apex including the apical anterior wall.  In addition there is very mild peri-infarct ischemia mostly towards mid anterolateral wall  Heart Catheterization: Coronary angiogram 05/27/2017: Mild global hypokinesis, EF 45%.  No significant mitral regurgitation.  Normal LVEDP.  Left main normal.  LAD occluded in the proximal segment.  Distal LAD supplied by LIMA, LIMA to LAD widely patent.  Native LAD diffusely diseased and small caliber.  Circumflex coronary artery proximal segment 40% stenosis, moderate-sized OM1 with high-grade complex acute angle 95% stenosis.  Large OM 2 with mild 30% proximal stenosis.  RCA previously placed stents show about a 30% to 40% in-stent restenosis especially in the proximal segment but does not appear angiographically significant.   Recommendation: Patient's nuclear stress test is probably abnormal due to a moderate-sized OM1 high-grade stenosis.  However the anatomy is not very conducive for angioplasty, would recommend medical therapy.  Overall low risk for sudden cardiac death. Complex coronary anatomy, left radial access utilized, AL to catheter utilized to engage left main.  160 mL contrast utilized.  There was no immediate complication.  He will be discharged home today with outpatient follow-up.  Abdominal Ultrasound  [03/21/2015]: No AAA observed. Mild atheresclerosis.  LABORATORY DATA: CBC Latest Ref Rng & Units 09/14/2019 10/26/2008 10/25/2008  WBC 4.0 - 10.5 K/uL 8.0 5.2 5.1  Hemoglobin 13.0 - 17.0 g/dL 16.2 15.1 14.9  Hematocrit 39.0 - 52.0 % 47.5 43.7 42.7  Platelets 150 - 400 K/uL 133(L) 103(L) 114(L)    CMP Latest Ref Rng & Units 09/14/2019 10/26/2008 10/25/2008  Glucose 70 - 99 mg/dL 90 87 91  BUN 8 - 23 mg/dL '15 7 11  '$ Creatinine 0.61 - 1.24 mg/dL 0.91 0.95 0.97  Sodium 135 - 145 mmol/L 136 138 137  Potassium 3.5 - 5.1 mmol/L 4.1 3.7 3.6  Chloride 98 - 111 mmol/L 100 109 108  CO2 22 - 32 mmol/L '26 25 26  '$ Calcium 8.9 - 10.3 mg/dL 9.2 8.8 9.0  Total Protein 6.5 - 8.1 g/dL 7.2 - -  Total Bilirubin  0.3 - 1.2 mg/dL 1.0 - -  Alkaline Phos 38 - 126 U/L 60 - -  AST 15 - 41 U/L 23 - -  ALT 0 - 44 U/L 20 - -    Lipid Panel     Component Value Date/Time   CHOL  10/25/2008 0430    114        ATP III CLASSIFICATION:  <200     mg/dL   Desirable  200-239  mg/dL   Borderline High  >=240    mg/dL   High          TRIG 46 10/25/2008 0430   HDL 35 (L) 10/25/2008 0430   CHOLHDL 3.3 10/25/2008 0430   VLDL 9 10/25/2008 0430   LDLCALC  10/25/2008 0430    70        Total Cholesterol/HDL:CHD  Risk Coronary Heart Disease Risk Table                     Men   Women  1/2 Average Risk   3.4   3.3  Average Risk       5.0   4.4  2 X Average Risk   9.6   7.1  3 X Average Risk  23.4   11.0        Use the calculated Patient Ratio above and the CHD Risk Table to determine the patient's CHD Risk.        ATP III CLASSIFICATION (LDL):  <100     mg/dL   Optimal  100-129  mg/dL   Near or Above                    Optimal  130-159  mg/dL   Borderline  160-189  mg/dL   High  >190     mg/dL   Very High    Lab Results  Component Value Date   HGBA1C  10/24/2008    4.8 (NOTE)   The ADA recommends the following therapeutic goal for glycemic   control related to Hgb A1C measurement:   Goal of Therapy:   < 7.0% Hgb A1C   Reference: American Diabetes Association: Clinical Practice   Recommendations 2008, Diabetes Care,  2008, 31:(Suppl 1).   No components found for: NTPROBNP  Cardiac Panel (last 3 results) No results for input(s): CKTOTAL, CKMB, TROPONINIHS, RELINDX in the last 72 hours.  IMPRESSION:    ICD-10-CM   1. Atherosclerosis of native coronary artery of native heart without angina pectoris  I25.10 EKG 12-Lead    metoprolol succinate (TOPROL-XL) 25 MG 24 hr tablet    2. History of coronary artery bypass graft x 3  Z95.1 metoprolol succinate (TOPROL-XL) 25 MG 24 hr tablet    3. Essential hypertension  I10     4. Obstructive sleep apnea on CPAP  G47.33    Z99.89     5. Pure  hypercholesterolemia  E78.00     6. Former smoker  Z87.891         RECOMMENDATIONS: Dean Wilkins is a 75 y.o. male whose past medical history and cardiovascular risk factors include: Hypertension, established coronary disease prior CABG in 1999 (LIMA to LAD, SVG to OM, SVG to RCA), hyperlipidemia, OSA on CPAP, Hx of SVT, advanced age.  Established coronary artery disease with prior coronary interventions and bypass surgery without angina pectoris: Chest pain-free. EKG is nonischemic. Echocardiogram since last office visit notes improvement in LVEF from 40-45% to 55-60%.   Patient had a left heart catheterization back in 2018 results reviewed during today's encounter. We discussed undergoing another stress test to reevaluate his ischemic burden; however, since he remains relatively stable the shared decision was to hold off on additional testing at this time. Encouraged him to increase his physical activity by either doing shorter intervals more often given his underlying sciatica or even consider physical therapy.  Patient states that he will discuss it further with PCP. Will request most recent labs from his PCPs office as well. Educated on importance of secondary prevention. Refilled Toprol-XL.  Benign essential hypertension:  Patient's office blood pressure within acceptable range.   Home blood pressures are better controlled.   Educated on importance of a low-salt diet.   Currently managed by primary care provider.  Hyperlipidemia: Continue statin therapy.  Currently managed by primary care provider.  FINAL  MEDICATION LIST END OF ENCOUNTER: Meds ordered this encounter  Medications   metoprolol succinate (TOPROL-XL) 25 MG 24 hr tablet    Sig: Take 1 tablet (25 mg total) by mouth in the morning.    Dispense:  90 tablet    Refill:  3     Current Outpatient Medications:    aspirin EC 81 MG tablet, Take 81 mg by mouth daily. Swallow whole., Disp: , Rfl:    isosorbide  mononitrate (IMDUR) 30 MG 24 hr tablet, Take 30 mg by mouth every evening. , Disp: , Rfl:    nitroGLYCERIN (NITROSTAT) 0.4 MG SL tablet, Place 0.4 mg under the tongue every 5 (five) minutes as needed for chest pain. , Disp: , Rfl:    Omega-3 Fatty Acids (FISH OIL) 1000 MG CAPS, Take 2,000 mg by mouth daily. , Disp: , Rfl:    quinapril (ACCUPRIL) 20 MG tablet, Take 1 tablet (20 mg total) by mouth at bedtime., Disp: 90 tablet, Rfl: 3   simvastatin (ZOCOR) 40 MG tablet, Take 20 mg by mouth every evening. , Disp: , Rfl:    triamterene-hydrochlorothiazide (MAXZIDE-25) 37.5-25 MG tablet, Take 1 tablet by mouth 4 (four) times a week. , Disp: , Rfl:    metoprolol succinate (TOPROL-XL) 25 MG 24 hr tablet, Take 1 tablet (25 mg total) by mouth in the morning., Disp: 90 tablet, Rfl: 3  Orders Placed This Encounter  Procedures   EKG 12-Lead   --Continue cardiac medications as reconciled in final medication list. --Return in about 1 year (around 03/12/2022) for Follow up, CAD. Or sooner if needed. --Continue follow-up with your primary care physician regarding the management of your other chronic comorbid conditions.  Patient's questions and concerns were addressed to his satisfaction. He voices understanding of the instructions provided during this encounter.   This note was created using a voice recognition software as a result there may be grammatical errors inadvertently enclosed that do not reflect the nature of this encounter. Every attempt is made to correct such errors.  Rex Kras, [place], [facility]  Pager: [phone] Office: [phone]

## 2021-05-18 IMAGING — DX DG PORTABLE PELVIS
1 series · 1 of 1 positions shown · non-contrast
Comparison: None.

CLINICAL DATA: Status post left hip arthroplasty.

EXAM:
PORTABLE PELVIS 1-2 VIEWS

[pelvis ap]
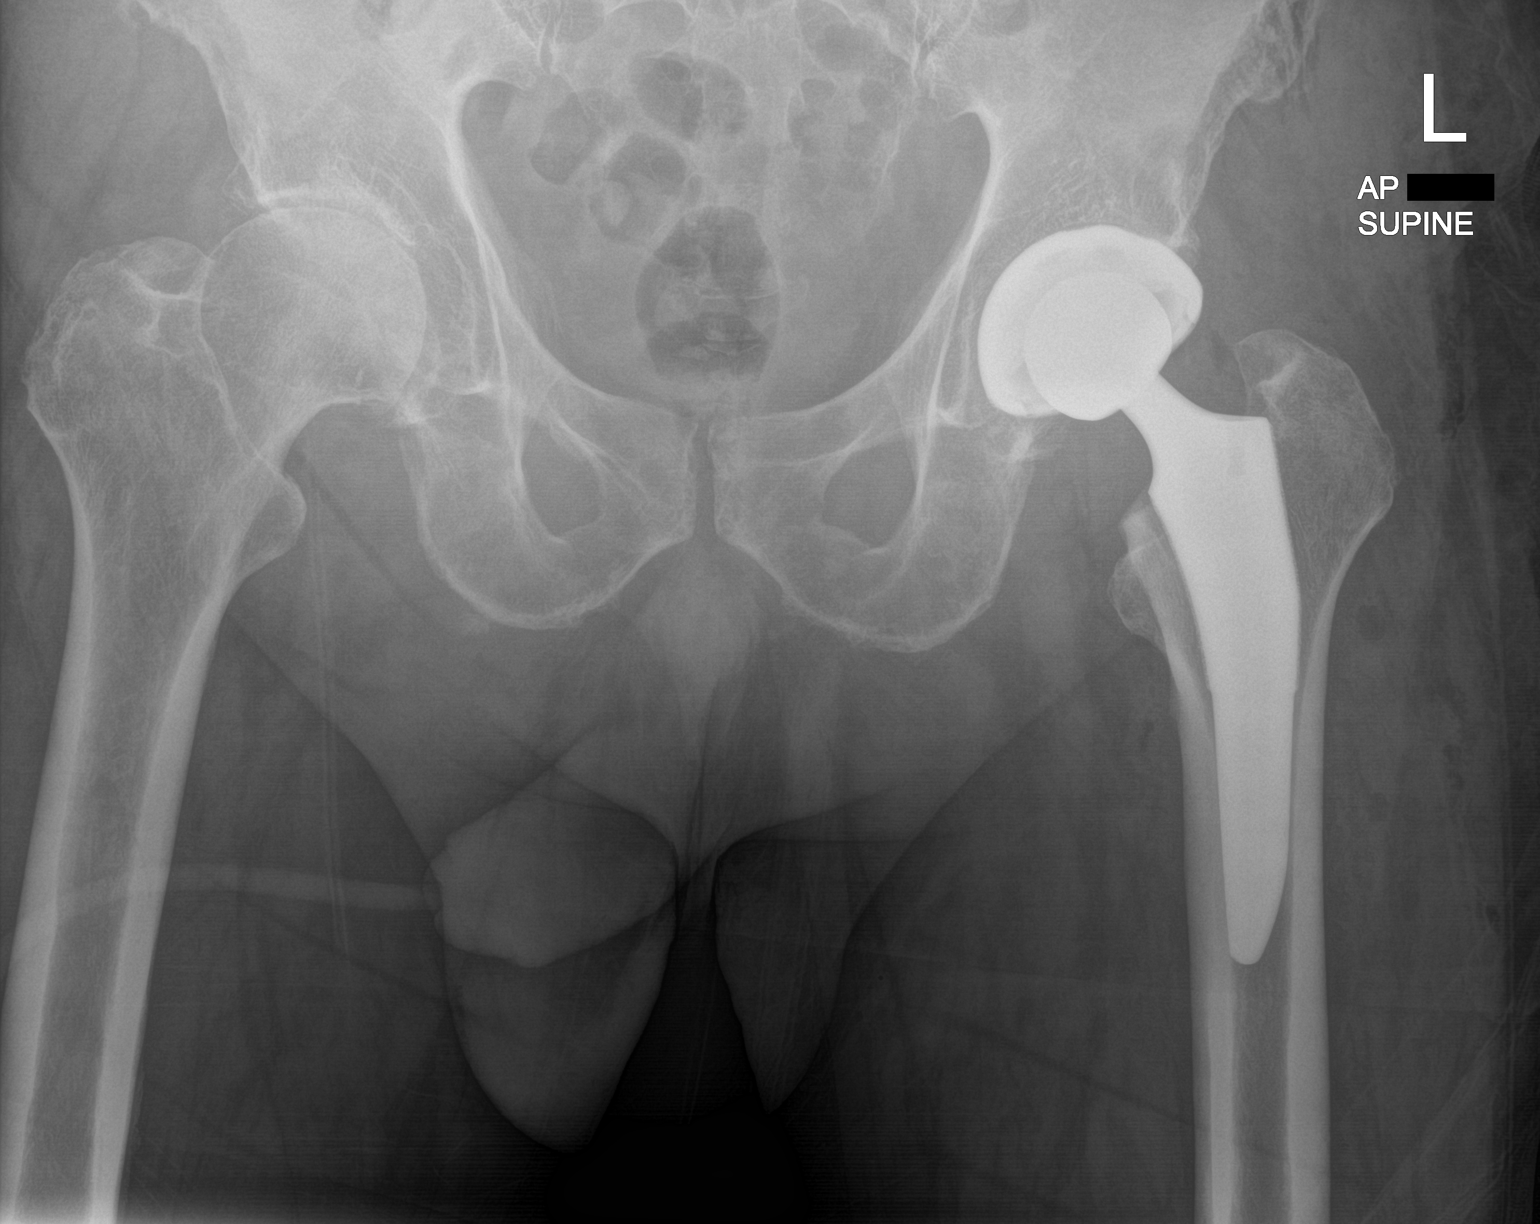

[1 of 1 positions shown; findings below may reference images not displayed]

FINDINGS: The left acetabular and femoral components appear to be well
situated. Expected postoperative changes are seen in the surrounding
soft tissues.
IMPRESSION: Status post left total hip arthroplasty.

## 2022-01-25 ENCOUNTER — Telehealth: Payer: Self-pay | Admitting: Cardiology

## 2022-01-25 DIAGNOSIS — Z01818 Encounter for other preprocedural examination: Secondary | ICD-10-CM

## 2022-01-25 NOTE — Telephone Encounter (Signed)
Patient is being scheduled for right hip replacement with [facility] and cardiology is asked to provide preoperative risk stratification.  Last office visit 03/12/2021.  Since last office visit patient states that he has not had any anginal discomfort or heart failure symptoms.    No hospitalizations for heart related symptoms.  Patient is overall functional capacity remains stable but limited due to hip pain.  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  He has not required the use of sublingual nitroglycerin tablets over the last 4 years.  He had an EKG with his PCP -we will obtain records..   Last echocardiogram in August 2022 noted improvement in LVEF from 40-45% to 55-60%, no significant valvular heart disease.  May hold aspirin for 7 days prior to surgery and restart when appropriate hemostasis is achieved-we will defer the timing for reinitiation to his surgeon.  From a cardiovascular standpoint patient is considered to be acceptable risk for upcoming noncardiac surgery.  Both patient and wife verbalized understanding.    Final decision to proceed with surgery will be dependent on the patient and the surgeon after discussing the risks, complications, and alternatives.  Please reach out if any questions or concerns arise.  Total time spent: 13 minutes.  Rex Kras, [place], [facility]  Pager: [phone] Office: [phone]

## 2022-02-01 ENCOUNTER — Telehealth: Payer: Self-pay | Admitting: Cardiology

## 2022-02-01 NOTE — Telephone Encounter (Signed)
Outside EKG obtained and reviewed.  Preop letter sent to the referring provider.  Rex Kras, [place], [facility]  Pager: [phone] Office: [phone]

## 2022-02-04 ENCOUNTER — Ambulatory Visit: Payer: Self-pay | Admitting: Student

## 2022-02-11 ENCOUNTER — Ambulatory Visit: Payer: Self-pay | Admitting: Student

## 2022-02-11 NOTE — H&P (Signed)
TOTAL HIP ADMISSION H&P  Patient is admitted for right total hip arthroplasty.  Subjective:  Chief Complaint: right hip pain  HPI: [employer], 76 y.o. male, has a history of pain and functional disability in the right hip(s) due to arthritis and patient has failed non-surgical conservative treatments for greater than 12 weeks to include NSAID's and/or analgesics, flexibility and strengthening excercises, supervised PT with diminished ADL's post treatment, use of assistive devices, and activity modification.  Onset of symptoms was gradual starting 9 years ago with rapidlly worsening course since that time.The patient noted no past surgery on the right hip(s).  Patient currently rates pain in the right hip at 10 out of 10 with activity. Patient has night pain, worsening of pain with activity and weight bearing, trendelenberg gait, pain that interfers with activities of daily living, and pain with passive range of motion. Patient has evidence of subchondral cysts, subchondral sclerosis, periarticular osteophytes, and joint space narrowing by imaging studies. This condition presents safety issues increasing the risk of falls. There is no current active infection.  Patient Active Problem List   Diagnosis Date Noted   Osteoarthritis of left hip 09/16/2019   PVC (premature ventricular contraction) 03/02/2019   NSVT (nonsustained ventricular tachycardia) ([place]) 03/02/2019   HTN (hypertension) 03/02/2019   Atherosclerosis of native coronary artery of native heart with angina pectoris ([place]) 03/02/2019   HLD (hyperlipidemia) 03/02/2019   History of coronary artery bypass graft x 3 03/02/2019   History of PTCA 03/02/2019   Coronary artery disease 05/25/2017   Past Medical History:  Diagnosis Date   Aortic sclerosis    Atherosclerosis of native coronary artery of native heart with angina pectoris ([place]) 03/02/2019   Coronary artery disease    Dysrhythmia    PVC's, V-tach   Exogenous obesity     Heart murmur    History of coronary artery bypass graft x 3 03/02/2019   History of PTCA 03/02/2019   HLD (hyperlipidemia) 03/02/2019   HTN (hypertension) 03/02/2019   Hyperlipidemia    Hypertension    Left ventricular hypertrophy    NSVT (nonsustained ventricular tachycardia) ([place]) 03/02/2019   PVC (premature ventricular contraction) 03/02/2019    Past Surgical History:  Procedure Laterality Date   CORONARY ARTERY BYPASS GRAFT  1999   DES stent to mid native RCA /DES stent to mild RCA  2008/2009   EYE SURGERY  2019,2020   bilateral cataract surgery with lens implant   LEFT HEART CATH AND CORS/GRAFTS ANGIOGRAPHY N/A 05/27/2017   Procedure: LEFT HEART CATH AND CORS/GRAFTS ANGIOGRAPHY;  Surgeon: Adrian Prows, MD;  Location: [facility];  Service: Cardiovascular;  Laterality: N/A;   NM MYOVIEW LTD     2013   TONSILLECTOMY     TOTAL HIP ARTHROPLASTY Left 09/16/2019   Procedure: TOTAL HIP ARTHROPLASTY ANTERIOR APPROACH;  Surgeon: Rod Can, MD;  Location: [facility];  Service: Orthopedics;  Laterality: Left;   TOTAL HIP ARTHROPLASTY      Current Outpatient Medications  Medication Sig Dispense Refill Last Dose   aspirin EC 81 MG tablet Take 81 mg by mouth daily. Swallow whole.      isosorbide mononitrate (IMDUR) 30 MG 24 hr tablet Take 30 mg by mouth every evening.       metoprolol succinate (TOPROL-XL) 25 MG 24 hr tablet Take 1 tablet (25 mg total) by mouth in the morning. 90 tablet 3    nitroGLYCERIN (NITROSTAT) 0.4 MG SL tablet Place 0.4 mg under the tongue every 5 (five)  minutes as needed for chest pain.       Omega-3 Fatty Acids (FISH OIL) 1000 MG CAPS Take 2,000 mg by mouth daily.       quinapril (ACCUPRIL) 20 MG tablet Take 1 tablet (20 mg total) by mouth at bedtime. 90 tablet 3    simvastatin (ZOCOR) 40 MG tablet Take 20 mg by mouth every evening.       triamterene-hydrochlorothiazide (MAXZIDE-25) 37.5-25 MG tablet Take 1 tablet by mouth 4 (four) times a week.       No current  facility-administered medications for this visit.   Allergies  Allergen Reactions   Etodolac Other (See Comments)    Unknown allergic reaction.   Lipitor [Atorvastatin] Other (See Comments)    Muscle aches/leg pains.   Niaspan [Niacin Er] Other (See Comments)    Patient cannot remember what the reaction was.    Social History   Tobacco Use   Smoking status: Former    Packs/day: 0.25    Years: 1.00    Total pack years: 0.25    Types: Cigarettes    Quit date: 1970    Years since quitting: 53.5   Smokeless tobacco: Never   Tobacco comments:    only smoked for a year  Substance Use Topics   Alcohol use: Not Currently    Family History  Problem Relation Age of Onset   Heart attack Brother      Review of Systems  Musculoskeletal:  Positive for arthralgias, back pain, gait problem and myalgias.  All other systems reviewed and are negative.   Objective:  Physical Exam Constitutional:      Appearance: Normal appearance.  HENT:     Head: Normocephalic and atraumatic.     Nose: Nose normal.     Mouth/Throat:     Mouth: Mucous membranes are moist.     Pharynx: Oropharynx is clear.  Eyes:     Extraocular Movements: Extraocular movements intact.  Cardiovascular:     Rate and Rhythm: Normal rate and regular rhythm.     Pulses: Normal pulses.     Heart sounds: Normal heart sounds.  Pulmonary:     Effort: Pulmonary effort is normal.     Breath sounds: Normal breath sounds.  Abdominal:     General: Abdomen is flat.     Palpations: Abdomen is soft.  Genitourinary:    Comments: deferred Musculoskeletal:     Cervical back: Normal range of motion and neck supple.     Comments: Examination of the right hip reveals no skin wounds, lesions, rashes, or erythema. Pain with flexion and rotation of the hip. He has severely restricted ROM. Mild trochanteric tenderness to palpation.   Sensory and motor function intact in LE bilaterally. Distal pedal pulses 2+ bilaterally.   No  significant pedal edema. Calves soft and non-tender.   Skin:    General: Skin is warm and dry.     Capillary Refill: Capillary refill takes less than 2 seconds.  Neurological:     General: No focal deficit present.     Mental Status: He is alert and oriented to person, place, and time.  Psychiatric:        Mood and Affect: Mood normal.        Behavior: Behavior normal.        Thought Content: Thought content normal.        Judgment: Judgment normal.     Vital signs in last 24 hours: '@VSRANGES'$ @  Labs:   Estimated body  mass index is 31.69 kg/m as calculated from the following:   Height as of 03/12/21: '6\' 1"'$  (1.854 m).   Weight as of 03/12/21: 109 kg.   Imaging Review Plain radiographs demonstrate severe degenerative joint disease of the right hip(s). The bone quality appears to be adequate for age and reported activity level.      Assessment/Plan:  End stage arthritis, right hip(s)  The patient history, physical examination, clinical judgement of the provider and imaging studies are consistent with end stage degenerative joint disease of the right hip(s) and total hip arthroplasty is deemed medically necessary. The treatment options including medical management, injection therapy, arthroscopy and arthroplasty were discussed at length. The risks and benefits of total hip arthroplasty were presented and reviewed. The risks due to aseptic loosening, infection, stiffness, dislocation/subluxation,  thromboembolic complications and other imponderables were discussed.  The patient acknowledged the explanation, agreed to proceed with the plan and consent was signed. Patient is being admitted for inpatient treatment for surgery, pain control, PT, OT, prophylactic antibiotics, VTE prophylaxis, progressive ambulation and ADL's and discharge planning.The patient is planning to be discharged home with HEP.   Therapy Plans: HEP.  Disposition: Home with wife Planned DVT Prophylaxis: aspirin  '81mg'$  BID DME needed: None has rolling walker.  PCP: Cleared.  Cardiology. Cleared. Stop aspirin 7 days before surgery.  TXA: IV Allergies:  - Lipitor - muscle pain - Niaspan - flushing.  - Etodolac - unknown (he doesn't remember reaction). - Latex - rash - Adhesive - Rash. Anesthesia Concerns: None BMI: 31.4%.  Last HgbA1c: 5.1  Other: - Chronic low back pain and sciatica, history of lumbar injection 10-2021 (Dr. Leory Plowman). We discussed that his spine condition may flare-up after hip surgery due to change in his gait. Bothers with increased standing.  - Heart- open heart surgery quadruple bypass. 6 stents since.  - Aspirin '81mg'$  daily at baseline.  - Left THA 2021, delayed wound healing from prior.  - Cr. 1.03 - Hydrocodone, meloxicam, zofran.    Patient's anticipated LOS is less than 2 midnights, meeting these requirements: - Younger than 62 - Lives within 1 hour of care - Has a competent adult at home to recover with post-op recover - NO history of  - Chronic pain requiring opiods  - Diabetes  - Coronary Artery Disease  - Heart failure  - Heart attack  - Stroke  - DVT/VTE  - Cardiac arrhythmia  - Respiratory Failure/COPD  - Renal failure  - Anemia  - Advanced Liver disease

## 2022-02-11 NOTE — H&P (View-Only) (Signed)
TOTAL HIP ADMISSION H&P  Patient is admitted for right total hip arthroplasty.  Subjective:  Chief Complaint: right hip pain  HPI: [employer], 76 y.o. male, has a history of pain and functional disability in the right hip(s) due to arthritis and patient has failed non-surgical conservative treatments for greater than 12 weeks to include NSAID's and/or analgesics, flexibility and strengthening excercises, supervised PT with diminished ADL's post treatment, use of assistive devices, and activity modification.  Onset of symptoms was gradual starting 9 years ago with rapidlly worsening course since that time.The patient noted no past surgery on the right hip(s).  Patient currently rates pain in the right hip at 10 out of 10 with activity. Patient has night pain, worsening of pain with activity and weight bearing, trendelenberg gait, pain that interfers with activities of daily living, and pain with passive range of motion. Patient has evidence of subchondral cysts, subchondral sclerosis, periarticular osteophytes, and joint space narrowing by imaging studies. This condition presents safety issues increasing the risk of falls. There is no current active infection.  Patient Active Problem List   Diagnosis Date Noted   Osteoarthritis of left hip 09/16/2019   PVC (premature ventricular contraction) 03/02/2019   NSVT (nonsustained ventricular tachycardia) ([place]) 03/02/2019   HTN (hypertension) 03/02/2019   Atherosclerosis of native coronary artery of native heart with angina pectoris ([place]) 03/02/2019   HLD (hyperlipidemia) 03/02/2019   History of coronary artery bypass graft x 3 03/02/2019   History of PTCA 03/02/2019   Coronary artery disease 05/25/2017   Past Medical History:  Diagnosis Date   Aortic sclerosis    Atherosclerosis of native coronary artery of native heart with angina pectoris ([place]) 03/02/2019   Coronary artery disease    Dysrhythmia    PVC's, V-tach   Exogenous obesity     Heart murmur    History of coronary artery bypass graft x 3 03/02/2019   History of PTCA 03/02/2019   HLD (hyperlipidemia) 03/02/2019   HTN (hypertension) 03/02/2019   Hyperlipidemia    Hypertension    Left ventricular hypertrophy    NSVT (nonsustained ventricular tachycardia) ([place]) 03/02/2019   PVC (premature ventricular contraction) 03/02/2019    Past Surgical History:  Procedure Laterality Date   CORONARY ARTERY BYPASS GRAFT  1999   DES stent to mid native RCA /DES stent to mild RCA  2008/2009   EYE SURGERY  2019,2020   bilateral cataract surgery with lens implant   LEFT HEART CATH AND CORS/GRAFTS ANGIOGRAPHY N/A 05/27/2017   Procedure: LEFT HEART CATH AND CORS/GRAFTS ANGIOGRAPHY;  Surgeon: Adrian Prows, MD;  Location: [facility];  Service: Cardiovascular;  Laterality: N/A;   NM MYOVIEW LTD     2013   TONSILLECTOMY     TOTAL HIP ARTHROPLASTY Left 09/16/2019   Procedure: TOTAL HIP ARTHROPLASTY ANTERIOR APPROACH;  Surgeon: Rod Can, MD;  Location: [facility];  Service: Orthopedics;  Laterality: Left;   TOTAL HIP ARTHROPLASTY      Current Outpatient Medications  Medication Sig Dispense Refill Last Dose   aspirin EC 81 MG tablet Take 81 mg by mouth daily. Swallow whole.      isosorbide mononitrate (IMDUR) 30 MG 24 hr tablet Take 30 mg by mouth every evening.       metoprolol succinate (TOPROL-XL) 25 MG 24 hr tablet Take 1 tablet (25 mg total) by mouth in the morning. 90 tablet 3    nitroGLYCERIN (NITROSTAT) 0.4 MG SL tablet Place 0.4 mg under the tongue every 5 (five)  minutes as needed for chest pain.       Omega-3 Fatty Acids (FISH OIL) 1000 MG CAPS Take 2,000 mg by mouth daily.       quinapril (ACCUPRIL) 20 MG tablet Take 1 tablet (20 mg total) by mouth at bedtime. 90 tablet 3    simvastatin (ZOCOR) 40 MG tablet Take 20 mg by mouth every evening.       triamterene-hydrochlorothiazide (MAXZIDE-25) 37.5-25 MG tablet Take 1 tablet by mouth 4 (four) times a week.       No current  facility-administered medications for this visit.   Allergies  Allergen Reactions   Etodolac Other (See Comments)    Unknown allergic reaction.   Lipitor [Atorvastatin] Other (See Comments)    Muscle aches/leg pains.   Niaspan [Niacin Er] Other (See Comments)    Patient cannot remember what the reaction was.    Social History   Tobacco Use   Smoking status: Former    Packs/day: 0.25    Years: 1.00    Total pack years: 0.25    Types: Cigarettes    Quit date: 1970    Years since quitting: 53.5   Smokeless tobacco: Never   Tobacco comments:    only smoked for a year  Substance Use Topics   Alcohol use: Not Currently    Family History  Problem Relation Age of Onset   Heart attack Brother      Review of Systems  Musculoskeletal:  Positive for arthralgias, back pain, gait problem and myalgias.  All other systems reviewed and are negative.   Objective:  Physical Exam Constitutional:      Appearance: Normal appearance.  HENT:     Head: Normocephalic and atraumatic.     Nose: Nose normal.     Mouth/Throat:     Mouth: Mucous membranes are moist.     Pharynx: Oropharynx is clear.  Eyes:     Extraocular Movements: Extraocular movements intact.  Cardiovascular:     Rate and Rhythm: Normal rate and regular rhythm.     Pulses: Normal pulses.     Heart sounds: Normal heart sounds.  Pulmonary:     Effort: Pulmonary effort is normal.     Breath sounds: Normal breath sounds.  Abdominal:     General: Abdomen is flat.     Palpations: Abdomen is soft.  Genitourinary:    Comments: deferred Musculoskeletal:     Cervical back: Normal range of motion and neck supple.     Comments: Examination of the right hip reveals no skin wounds, lesions, rashes, or erythema. Pain with flexion and rotation of the hip. He has severely restricted ROM. Mild trochanteric tenderness to palpation.   Sensory and motor function intact in LE bilaterally. Distal pedal pulses 2+ bilaterally.   No  significant pedal edema. Calves soft and non-tender.   Skin:    General: Skin is warm and dry.     Capillary Refill: Capillary refill takes less than 2 seconds.  Neurological:     General: No focal deficit present.     Mental Status: He is alert and oriented to person, place, and time.  Psychiatric:        Mood and Affect: Mood normal.        Behavior: Behavior normal.        Thought Content: Thought content normal.        Judgment: Judgment normal.     Vital signs in last 24 hours: '@VSRANGES'$ @  Labs:   Estimated body  mass index is 31.69 kg/m as calculated from the following:   Height as of 03/12/21: '6\' 1"'$  (1.854 m).   Weight as of 03/12/21: 109 kg.   Imaging Review Plain radiographs demonstrate severe degenerative joint disease of the right hip(s). The bone quality appears to be adequate for age and reported activity level.      Assessment/Plan:  End stage arthritis, right hip(s)  The patient history, physical examination, clinical judgement of the provider and imaging studies are consistent with end stage degenerative joint disease of the right hip(s) and total hip arthroplasty is deemed medically necessary. The treatment options including medical management, injection therapy, arthroscopy and arthroplasty were discussed at length. The risks and benefits of total hip arthroplasty were presented and reviewed. The risks due to aseptic loosening, infection, stiffness, dislocation/subluxation,  thromboembolic complications and other imponderables were discussed.  The patient acknowledged the explanation, agreed to proceed with the plan and consent was signed. Patient is being admitted for inpatient treatment for surgery, pain control, PT, OT, prophylactic antibiotics, VTE prophylaxis, progressive ambulation and ADL's and discharge planning.The patient is planning to be discharged home with HEP.   Therapy Plans: HEP.  Disposition: Home with wife Planned DVT Prophylaxis: aspirin  '81mg'$  BID DME needed: None has rolling walker.  PCP: Cleared.  Cardiology. Cleared. Stop aspirin 7 days before surgery.  TXA: IV Allergies:  - Lipitor - muscle pain - Niaspan - flushing.  - Etodolac - unknown (he doesn't remember reaction). - Latex - rash - Adhesive - Rash. Anesthesia Concerns: None BMI: 31.4%.  Last HgbA1c: 5.1  Other: - Chronic low back pain and sciatica, history of lumbar injection 10-2021 (Dr. Leory Plowman). We discussed that his spine condition may flare-up after hip surgery due to change in his gait. Bothers with increased standing.  - Heart- open heart surgery quadruple bypass. 6 stents since.  - Aspirin '81mg'$  daily at baseline.  - Left THA 2021, delayed wound healing from prior.  - Cr. 1.03 - Hydrocodone, meloxicam, zofran.    Patient's anticipated LOS is less than 2 midnights, meeting these requirements: - Younger than 37 - Lives within 1 hour of care - Has a competent adult at home to recover with post-op recover - NO history of  - Chronic pain requiring opiods  - Diabetes  - Coronary Artery Disease  - Heart failure  - Heart attack  - Stroke  - DVT/VTE  - Cardiac arrhythmia  - Respiratory Failure/COPD  - Renal failure  - Anemia  - Advanced Liver disease

## 2022-02-21 NOTE — Patient Instructions (Addendum)
SURGICAL WAITING ROOM VISITATION Patients having surgery or a procedure may have no more than 2 support people in the waiting area - these visitors may rotate.   Children under the age of 70 must have an adult with them who is not the patient. If the patient needs to stay at the hospital during part of their recovery, the visitor guidelines for inpatient rooms apply. Pre-op nurse will coordinate an appropriate time for 1 support person to accompany patient in pre-op.  This support person may not rotate.    Please refer to the [facility] website for the visitor guidelines for Inpatients (after your surgery is over and you are in a regular room).    Your procedure is scheduled on: 03/06/22   Report to [facility] Main Entrance    Report to admitting at 9:00 AM   Call this number if you have problems the morning of surgery [phone]   Do not eat food :After Midnight.   After Midnight you may have the following liquids until 8:30 AM DAY OF SURGERY  Water Non-Citrus Juices (without pulp, NO RED) Carbonated Beverages Black Coffee (NO MILK/CREAM OR CREAMERS, sugar ok)  Clear Tea (NO MILK/CREAM OR CREAMERS, sugar ok) regular and decaf                             Plain Jell-O (NO RED)                                           Fruit ices (not with fruit pulp, NO RED)                                     Popsicles (NO RED)                                                               Sports drinks like Gatorade (NO RED)           The day of surgery:  Drink ONE (1) Pre-Surgery Clear Ensure at 8:30 AM the morning of surgery. Drink in one sitting. Do not sip.  This drink was given to you during your hospital  pre-op appointment visit. Nothing else to drink after completing the  Pre-Surgery Clear Ensure.          If you have questions, please contact your surgeon's office.   FOLLOW BOWEL PREP AND ANY ADDITIONAL PRE OP INSTRUCTIONS YOU RECEIVED FROM YOUR SURGEON'S OFFICE!!!      Oral Hygiene is also important to reduce your risk of infection.                                    Remember - BRUSH YOUR TEETH THE MORNING OF SURGERY WITH YOUR REGULAR TOOTHPASTE   Take these medicines the morning of surgery with A SIP OF WATER: Metoprolol, Simvastatin  Bring CPAP mask and tubing day of surgery.  You may not have any metal on your body including hair pins, jewelry, and body piercing             Do not wear lotions, powders, cologne, or deodorant              Men may shave face and neck.   Do not bring valuables to the hospital. [place].   Bring small overnight bag day of surgery.   DO NOT [place]. PHARMACY WILL DISPENSE MEDICATIONS LISTED ON YOUR MEDICATION LIST TO YOU DURING YOUR ADMISSION [place]!    Patients discharged on the day of surgery will not be allowed to drive home.  Someone NEEDS to stay with you for the first 24 hours after anesthesia.   Special Instructions: Bring a copy of your healthcare power of attorney and living will documents         the day of surgery if you haven't scanned them before.              Please read over the following fact sheets you were given: IF YOU HAVE QUESTIONS ABOUT YOUR PRE-OP INSTRUCTIONS PLEASE CALL [phone]     [facility] - Preparing for Surgery Before surgery, you can play an important role.  Because skin is not sterile, your skin needs to be as free of germs as possible.  You can reduce the number of germs on your skin by washing with CHG (chlorahexidine gluconate) soap before surgery.  CHG is an antiseptic cleaner which kills germs and bonds with the skin to continue killing germs even after washing. Please DO NOT use if you have an allergy to CHG or antibacterial soaps.  If your skin becomes reddened/irritated stop using the CHG and inform your nurse when you arrive at Short Stay. Do not  shave (including legs and underarms) for at least 48 hours prior to the first CHG shower.  You may shave your face/neck.  Please follow these instructions carefully:  1.  Shower with CHG Soap the night before surgery and the  morning of surgery.  2.  If you choose to wash your hair, wash your hair first as usual with your normal  shampoo.  3.  After you shampoo, rinse your hair and body thoroughly to remove the shampoo.                             4.  Use CHG as you would any other liquid soap.  You can apply chg directly to the skin and wash.  Gently with a scrungie or clean washcloth.  5.  Apply the CHG Soap to your body ONLY FROM THE NECK DOWN.   Do   not use on face/ open                           Wound or open sores. Avoid contact with eyes, ears mouth and   genitals (private parts).                       Wash face,  Genitals (private parts) with your normal soap.             6.  Wash thoroughly, paying special attention to the area where your  surgery  will be performed.  7.  Thoroughly rinse your body with warm water from the neck down.  8.  DO NOT shower/wash with your normal soap after using and rinsing off the CHG Soap.                9.  Pat yourself dry with a clean towel.            10.  Wear clean pajamas.            11.  Place clean sheets on your bed the night of your first shower and do not  sleep with pets. Day of Surgery : Do not apply any lotions/deodorants the morning of surgery.  Please wear clean clothes to the hospital/surgery center.  FAILURE TO FOLLOW THESE INSTRUCTIONS MAY RESULT IN THE CANCELLATION OF YOUR SURGERY  PATIENT SIGNATURE_________________________________  NURSE SIGNATURE__________________________________  ________________________________________________________________________  WHAT IS A BLOOD TRANSFUSION? Blood Transfusion Information  A transfusion is the replacement of blood or some of its parts. Blood is made up of multiple cells which provide  different functions. Red blood cells carry oxygen and are used for blood loss replacement. White blood cells fight against infection. Platelets control bleeding. Plasma helps clot blood. Other blood products are available for specialized needs, such as hemophilia or other clotting disorders. BEFORE THE TRANSFUSION  Who gives blood for transfusions?  Healthy volunteers who are fully evaluated to make sure their blood is safe. This is blood bank blood. Transfusion therapy is the safest it has ever been in the practice of medicine. Before blood is taken from a donor, a complete history is taken to make sure that person has no history of diseases nor engages in risky social behavior (examples are intravenous drug use or sexual activity with multiple partners). The donor's travel history is screened to minimize risk of transmitting infections, such as malaria. The donated blood is tested for signs of infectious diseases, such as HIV and hepatitis. The blood is then tested to be sure it is compatible with you in order to minimize the chance of a transfusion reaction. If you or a relative donates blood, this is often done in anticipation of surgery and is not appropriate for emergency situations. It takes many days to process the donated blood. RISKS AND COMPLICATIONS Although transfusion therapy is very safe and saves many lives, the main dangers of transfusion include:  Getting an infectious disease. Developing a transfusion reaction. This is an allergic reaction to something in the blood you were given. Every precaution is taken to prevent this. The decision to have a blood transfusion has been considered carefully by your caregiver before blood is given. Blood is not given unless the benefits outweigh the risks. AFTER THE TRANSFUSION Right after receiving a blood transfusion, you will usually feel much better and more energetic. This is especially true if your red blood cells have gotten low (anemic).  The transfusion raises the level of the red blood cells which carry oxygen, and this usually causes an energy increase. The nurse administering the transfusion will monitor you carefully for complications. HOME CARE INSTRUCTIONS  No special instructions are needed after a transfusion. You may find your energy is better. Speak with your caregiver about any limitations on activity for underlying diseases you may have. SEEK MEDICAL CARE IF:  Your condition is not improving after your transfusion. You develop redness or irritation at the intravenous (IV) site. SEEK IMMEDIATE MEDICAL CARE IF:  Any of the following symptoms occur  over the next 12 hours: Shaking chills. You have a temperature by mouth above 102 F (38.9 C), not controlled by medicine. Chest, back, or muscle pain. People around you feel you are not acting correctly or are confused. Shortness of breath or difficulty breathing. Dizziness and fainting. You get a rash or develop hives. You have a decrease in urine output. Your urine turns a dark color or changes to pink, red, or brown. Any of the following symptoms occur over the next 10 days: You have a temperature by mouth above 102 F (38.9 C), not controlled by medicine. Shortness of breath. Weakness after normal activity. The white part of the eye turns yellow (jaundice). You have a decrease in the amount of urine or are urinating less often. Your urine turns a dark color or changes to pink, red, or brown. Document Released: 07/05/2000 Document Revised: 09/30/2011 Document Reviewed: 02/22/2008 [facility] Patient Information 2014 [place], [place].  _______________________________________________________________________

## 2022-02-21 NOTE — Progress Notes (Addendum)
COVID Vaccine Completed: yes  Date of COVID positive in last 90 days: no  PCP - [facility] Cardiologist - Rex Kras, DO  Cardiac clearance by Rex Kras 02/01/22 on chart Clearance by Ray Church on chart Medical clearance by Dr. Luana Shu 12/26/21 on chart  Chest x-ray - n/a EKG -12/25/21 on chart Stress Test - 07/03/12 Epic ECHO - 2 years ago per pt Cardiac Cath - 05/27/17 Epic Pacemaker/ICD device last checked: n/a Spinal Cord Stimulator: n/a  Bowel Prep - no  Sleep Study - yes, positive CPAP - yes every night  Fasting Blood Sugar - n/a Checks Blood Sugar _____ times a day  Blood Thinner Instructions: Aspirin Instructions: ASA 81, hold 7 days Last Dose:  Activity level: Can go up a flight of stairs and perform activities of daily living without stopping and without symptoms of chest pain or shortness of breath.  Anesthesia review: CAD, PVC, NSVT, HTN, atherosclerosis, CABG x3, Na+ 129  Patient denies shortness of breath, fever, cough and chest pain at PAT appointment  Patient verbalized understanding of instructions that were given to them at the PAT appointment. Patient was also instructed that they will need to review over the PAT instructions again at home before surgery.

## 2022-02-22 ENCOUNTER — Encounter (HOSPITAL_COMMUNITY): Payer: Self-pay

## 2022-02-22 ENCOUNTER — Encounter (HOSPITAL_COMMUNITY)
Admission: RE | Admit: 2022-02-22 | Discharge: 2022-02-22 | Disposition: A | Discharge status: Home / Self Care | Payer: Medicare Other | Source: Ambulatory Visit | Attending: Orthopedic Surgery | Admitting: Orthopedic Surgery

## 2022-02-22 VITALS — BP 124/82 | HR 64 | Temp 98.3°F | Resp 14 | Ht 72.0 in | Wt 237.0 lb

## 2022-02-22 DIAGNOSIS — R011 Cardiac murmur, unspecified: Secondary | ICD-10-CM | POA: Diagnosis not present

## 2022-02-22 DIAGNOSIS — Z951 Presence of aortocoronary bypass graft: Secondary | ICD-10-CM | POA: Insufficient documentation

## 2022-02-22 DIAGNOSIS — Z01818 Encounter for other preprocedural examination: Secondary | ICD-10-CM

## 2022-02-22 DIAGNOSIS — I2510 Atherosclerotic heart disease of native coronary artery without angina pectoris: Secondary | ICD-10-CM | POA: Insufficient documentation

## 2022-02-22 DIAGNOSIS — Z01812 Encounter for preprocedural laboratory examination: Secondary | ICD-10-CM | POA: Diagnosis present

## 2022-02-22 DIAGNOSIS — I10 Essential (primary) hypertension: Secondary | ICD-10-CM | POA: Insufficient documentation

## 2022-02-22 DIAGNOSIS — G4733 Obstructive sleep apnea (adult) (pediatric): Secondary | ICD-10-CM | POA: Diagnosis not present

## 2022-02-22 DIAGNOSIS — M169 Osteoarthritis of hip, unspecified: Secondary | ICD-10-CM | POA: Diagnosis not present

## 2022-02-22 DIAGNOSIS — I472 Ventricular tachycardia, unspecified: Secondary | ICD-10-CM | POA: Diagnosis not present

## 2022-02-22 HISTORY — DX: Sleep apnea, unspecified: G47.30

## 2022-02-22 LAB — BASIC METABOLIC PANEL
Anion gap: 8 (ref 5–15)
BUN: 13 mg/dL (ref 8–23)
CO2: 24 mmol/L (ref 22–32)
Calcium: 9.2 mg/dL (ref 8.9–10.3)
Chloride: 97 mmol/L — ABNORMAL LOW (ref 98–111)
Creatinine, Ser: 1.03 mg/dL (ref 0.61–1.24)
GFR, Estimated: >60 mL/min (ref >60)
Glucose, Bld: 93 mg/dL (ref 70–99)
Potassium: 4.5 mmol/L (ref 3.5–5.1)
Sodium: 129 mmol/L — ABNORMAL LOW (ref 135–145)

## 2022-02-22 LAB — TYPE AND SCREEN
ABO/RH(D): O POS
Antibody Screen: NEGATIVE

## 2022-02-22 LAB — SURGICAL PCR SCREEN
MRSA, PCR: NEGATIVE
Staphylococcus aureus: NEGATIVE

## 2022-02-22 LAB — CBC
HCT: 46.5 % (ref 39.0–52.0)
Hemoglobin: 16 g/dL (ref 13.0–17.0)
MCH: 32.6 pg (ref 26.0–34.0)
MCHC: 34.4 g/dL (ref 30.0–36.0)
MCV: 94.7 fL (ref 80.0–100.0)
Platelets: 151 10*3/uL (ref 150–400)
RBC: 4.91 MIL/uL (ref 4.22–5.81)
RDW: 13.2 % (ref 11.5–15.5)
WBC: 7.9 10*3/uL (ref 4.0–10.5)
nRBC: 0 % (ref 0.0–0.2)

## 2022-02-22 NOTE — Progress Notes (Signed)
Na+ 129 results routed to Dr. Lyla Glassing.

## 2022-02-25 NOTE — Progress Notes (Signed)
Anesthesia Chart Review:   Case: [id number] Date/Time: 03/06/22 1115   Procedure: TOTAL HIP ARTHROPLASTY ANTERIOR APPROACH (Right: Hip)   Anesthesia type: Spinal   Pre-op diagnosis: Right hip osteoarthritis   [facility] ROOM 07 / [facility]   Surgeons: Rod Can, MD       DISCUSSION: Pt is 76 years old with hx CAD (s/p CABG x 3 2020), HTN, heart murmur, NSVT, OSA  - Na 129. Na was 130 at PCP's office on 12/25/21. I spoke with Naida Sleight, [place] in Dr. Sid Falcon office. Pt was instructed to reach out to PCP for guidance on hyponatremia. I suspect it is due to patient's Maxzide. Will recheck day of surgery.    VS: BP 124/82   Pulse 64   Temp 36.8 C (Oral)   Resp 14   Ht 6' (1.829 m)   Wt 107.5 kg   BMI 32.14 kg/m   PROVIDERS: - PCP is Guadalupe Maple, MD. Cleared for surgery by Ray Church, NP in PCP's office.  - Cardiologist is Rex Kras, DO. Last office visit 03/12/21.    LABS:  - Na 129. Na was 130 at PCP's office on 12/25/21  (all labs ordered are listed, but only abnormal results are displayed)  Labs Reviewed  BASIC METABOLIC PANEL - Abnormal; Notable for the following components:      Result Value   Sodium 129 (*)    Chloride 97 (*)    All other components within normal limits  SURGICAL PCR SCREEN  CBC  TYPE AND SCREEN    EKG 03/12/21: Normal sinus rhythm, 67 bpm, nonspecific T wave abnormality, without underlying ischemia or injury pattern   CV: Echo 02/23/21:  - Normal LV systolic function with visual EF 55-60%. Left ventricle cavity is normal in size. Mild left ventricular hypertrophy. Normal global wall motion. Abnormal septal wall motion due to post-op CABG. Normal diastolic filling pattern, normal LAP.  - Mild (Grade I) aortic regurgitation.  - Mild (Grade I) mitral regurgitation.  - Mild tricuspid regurgitation. No evidence of pulmonary hypertension.  - Mild to moderate pulmonic regurgitation.  - Compared to study 04/30/2017 LVEF improved from 40-45% to  55-60% otherwise no significant change.   Cardiac cath 05/27/17:  - Mild global hypokinesis, EF 45%.  No significant mitral regurgitation. Normal LVEDP.   - Left main normal.   - LAD occluded in the proximal segment. Distal LAD supplied by LIMA, LIMA to LAD widely patent. Native LAD diffusely diseased and small caliber.   - Circumflex coronary artery proximal segment 40% stenosis, moderate-sized OM1 with high-grade complex acute angle 95% stenosis. Large OM 2 with mild 30% proximal stenosis.   - RCA previously placed stents show about a 30% to 40% in-stent restenosis especially in the proximal segment but does not appear angiographically significant. -  Patient's nuclear stress test is probably abnormal due to a moderate-sized OM1 high-grade stenosis.  However the anatomy is not very conducive for angioplasty, would recommend medical therapy. - Overall low risk for sudden cardiac death.   Past Medical History:  Diagnosis Date   Aortic sclerosis    Atherosclerosis of native coronary artery of native heart with angina pectoris ([place]) 03/02/2019   Coronary artery disease    Dysrhythmia    PVC's, V-tach   Exogenous obesity    Heart murmur    History of coronary artery bypass graft x 3 03/02/2019   History of PTCA 03/02/2019   HLD (hyperlipidemia) 03/02/2019   HTN (hypertension) 03/02/2019   Hyperlipidemia  Hypertension    Left ventricular hypertrophy    NSVT (nonsustained ventricular tachycardia) ([place]) 03/02/2019   PVC (premature ventricular contraction) 03/02/2019   Sleep apnea     Past Surgical History:  Procedure Laterality Date   CORONARY ARTERY BYPASS GRAFT  1999   DES stent to mid native RCA /DES stent to mild RCA  2008/2009   EYE SURGERY  2019,2020   bilateral cataract surgery with lens implant   LEFT HEART CATH AND CORS/GRAFTS ANGIOGRAPHY N/A 05/27/2017   Procedure: LEFT HEART CATH AND CORS/GRAFTS ANGIOGRAPHY;  Surgeon: Adrian Prows, MD;  Location: [facility] [place] [facility];   Service: Cardiovascular;  Laterality: N/A;   NECK SURGERY  1991   NM MYOVIEW LTD     2013   TONSILLECTOMY     TOTAL HIP ARTHROPLASTY Left 09/16/2019   Procedure: TOTAL HIP ARTHROPLASTY ANTERIOR APPROACH;  Surgeon: Rod Can, MD;  Location[facility];  Service: Orthopedics;  Laterality: Left;   TOTAL HIP ARTHROPLASTY      MEDICATIONS:  aspirin EC 81 MG tablet   isosorbide mononitrate (IMDUR) 30 MG 24 hr tablet   lisinopril (ZESTRIL) 20 MG tablet   metoprolol succinate (TOPROL-XL) 25 MG 24 hr tablet   simvastatin (ZOCOR) 20 MG tablet   triamterene-hydrochlorothiazide (MAXZIDE-25) 37.5-25 MG tablet   No current facility-administered medications for this encounter.    If labs acceptable day of surgery, I anticipate pt can proceed with surgery as scheduled.  Willeen Cass, PhD, FNP-BC [facility]/Anesthesiology Phone: [phone] 02/26/2022 11:26 AM

## 2022-02-26 NOTE — Anesthesia Preprocedure Evaluation (Addendum)
Anesthesia Evaluation  Patient identified by MRN, date of birth, ID band Patient awake    Reviewed: Allergy & Precautions, H&P , NPO status , Patient's Chart, lab work & pertinent test results  Airway Mallampati: III  TM Distance: >3 FB Neck ROM: Full    Dental no notable dental hx. (+) Teeth Intact, Dental Advisory Given   Pulmonary sleep apnea and Continuous Positive Airway Pressure Ventilation , former smoker,    Pulmonary exam normal breath sounds clear to auscultation       Cardiovascular hypertension, Pt. on medications and Pt. on home beta blockers + CAD and + CABG   Rhythm:Regular Rate:Normal     Neuro/Psych negative neurological ROS  negative psych ROS   GI/Hepatic negative GI ROS, Neg liver ROS,   Endo/Other  negative endocrine ROS  Renal/GU negative Renal ROS  negative genitourinary   Musculoskeletal  (+) Arthritis , Osteoarthritis,    Abdominal   Peds  Hematology negative hematology ROS (+)   Anesthesia Other Findings   Reproductive/Obstetrics negative OB ROS                           Anesthesia Physical Anesthesia Plan  ASA: 3  Anesthesia Plan: Spinal   Post-op Pain Management: Tylenol PO (pre-op)*   Induction: Intravenous  PONV Risk Score and Plan: 2 and Ondansetron, Dexamethasone and Propofol infusion  Airway Management Planned: Natural Airway and Simple Face Mask  Additional Equipment:   Intra-op Plan:   Post-operative Plan:   Informed Consent: I have reviewed the patients History and Physical, chart, labs and discussed the procedure including the risks, benefits and alternatives for the proposed anesthesia with the patient or authorized representative who has indicated his/her understanding and acceptance.     Dental advisory given  Plan Discussed with: CRNA  Anesthesia Plan Comments: (See APP note by Durel Salts, FNP )       Anesthesia Quick  Evaluation

## 2022-03-06 ENCOUNTER — Ambulatory Visit (HOSPITAL_COMMUNITY)
Admission: RE | Admit: 2022-03-06 | Discharge: 2022-03-06 | Disposition: A | Discharge status: Home / Self Care | Payer: Medicare Other | Attending: Orthopedic Surgery | Admitting: Orthopedic Surgery

## 2022-03-06 ENCOUNTER — Ambulatory Visit (HOSPITAL_COMMUNITY): Payer: Medicare Other | Admitting: Emergency Medicine

## 2022-03-06 ENCOUNTER — Encounter (HOSPITAL_COMMUNITY)
Admission: RE | Disposition: A | Discharge status: Home / Self Care | Payer: Self-pay | Source: Home / Self Care | Attending: Orthopedic Surgery

## 2022-03-06 ENCOUNTER — Ambulatory Visit (HOSPITAL_BASED_OUTPATIENT_CLINIC_OR_DEPARTMENT_OTHER): Payer: Medicare Other | Admitting: Emergency Medicine

## 2022-03-06 ENCOUNTER — Encounter (HOSPITAL_COMMUNITY): Payer: Self-pay | Admitting: Orthopedic Surgery

## 2022-03-06 ENCOUNTER — Other Ambulatory Visit: Payer: Self-pay

## 2022-03-06 ENCOUNTER — Ambulatory Visit (HOSPITAL_COMMUNITY): Payer: Medicare Other

## 2022-03-06 DIAGNOSIS — G4730 Sleep apnea, unspecified: Secondary | ICD-10-CM | POA: Insufficient documentation

## 2022-03-06 DIAGNOSIS — Z87891 Personal history of nicotine dependence: Secondary | ICD-10-CM | POA: Diagnosis not present

## 2022-03-06 DIAGNOSIS — I10 Essential (primary) hypertension: Secondary | ICD-10-CM | POA: Insufficient documentation

## 2022-03-06 DIAGNOSIS — E871 Hypo-osmolality and hyponatremia: Secondary | ICD-10-CM

## 2022-03-06 DIAGNOSIS — M1611 Unilateral primary osteoarthritis, right hip: Secondary | ICD-10-CM | POA: Diagnosis present

## 2022-03-06 DIAGNOSIS — I2510 Atherosclerotic heart disease of native coronary artery without angina pectoris: Secondary | ICD-10-CM

## 2022-03-06 DIAGNOSIS — Z951 Presence of aortocoronary bypass graft: Secondary | ICD-10-CM | POA: Diagnosis not present

## 2022-03-06 HISTORY — PX: TOTAL HIP ARTHROPLASTY: SHX124

## 2022-03-06 LAB — BASIC METABOLIC PANEL
Anion gap: 9 (ref 5–15)
BUN: 13 mg/dL (ref 8–23)
CO2: 23 mmol/L (ref 22–32)
Calcium: 8.8 mg/dL — ABNORMAL LOW (ref 8.9–10.3)
Chloride: 102 mmol/L (ref 98–111)
Creatinine, Ser: 1 mg/dL (ref 0.61–1.24)
GFR, Estimated: >60 mL/min (ref >60)
Glucose, Bld: 141 mg/dL — ABNORMAL HIGH (ref 70–99)
Potassium: 4.2 mmol/L (ref 3.5–5.1)
Sodium: 134 mmol/L — ABNORMAL LOW (ref 135–145)

## 2022-03-06 SURGERY — ARTHROPLASTY, HIP, TOTAL, ANTERIOR APPROACH
Anesthesia: Spinal | Site: Hip | Laterality: Right

## 2022-03-06 MED ORDER — SENNA 8.6 MG PO TABS: 2.0000 | ORAL_TABLET | Freq: Every day | ORAL | 1 refills | Status: DC

## 2022-03-06 MED ORDER — KETOROLAC TROMETHAMINE 30 MG/ML IJ SOLN
INTRAMUSCULAR | Status: AC
  Filled 2022-03-06: qty 1

## 2022-03-06 MED ORDER — BUPIVACAINE-EPINEPHRINE (PF) 0.5% -1:200000 IJ SOLN
INTRAMUSCULAR | Status: DC | PRN
  Administered 2022-03-06: 30 mL

## 2022-03-06 MED ORDER — MORPHINE SULFATE (PF) 2 MG/ML IV SOLN: 0.5000 mg | INTRAVENOUS | Status: DC | PRN

## 2022-03-06 MED ORDER — PROPOFOL 500 MG/50ML IV EMUL
INTRAVENOUS | Status: DC | PRN
  Administered 2022-03-06: 75 ug/kg/min via INTRAVENOUS

## 2022-03-06 MED ORDER — CEFAZOLIN SODIUM-DEXTROSE 2-4 GM/100ML-% IV SOLN: 2.0000 g | Freq: Four times a day (QID) | INTRAVENOUS | Status: DC

## 2022-03-06 MED ORDER — POVIDONE-IODINE 10 % EX SWAB
2.0000 | Freq: Once | CUTANEOUS | Status: AC
  Administered 2022-03-06: 2 via TOPICAL

## 2022-03-06 MED ORDER — ONDANSETRON HCL 4 MG PO TABS: 4.0000 mg | ORAL_TABLET | Freq: Three times a day (TID) | ORAL | 0 refills | Status: DC | PRN

## 2022-03-06 MED ORDER — ONDANSETRON HCL 4 MG/2ML IJ SOLN: 4.0000 mg | Freq: Four times a day (QID) | INTRAMUSCULAR | Status: DC | PRN

## 2022-03-06 MED ORDER — ACETAMINOPHEN 500 MG PO TABS
1000.0000 mg | ORAL_TABLET | Freq: Once | ORAL | Status: AC
  Administered 2022-03-06: 1000 mg via ORAL
  Filled 2022-03-06: qty 2

## 2022-03-06 MED ORDER — POVIDONE-IODINE 10 % EX SWAB: 2.0000 | Freq: Once | CUTANEOUS | Status: DC

## 2022-03-06 MED ORDER — HYDROCODONE-ACETAMINOPHEN 7.5-325 MG PO TABS
ORAL_TABLET | ORAL | Status: AC
  Filled 2022-03-06: qty 1

## 2022-03-06 MED ORDER — METHOCARBAMOL 500 MG IVPB - SIMPLE MED
INTRAVENOUS | Status: AC
  Filled 2022-03-06: qty 55

## 2022-03-06 MED ORDER — HYDROCODONE-ACETAMINOPHEN 7.5-325 MG PO TABS
1.0000 | ORAL_TABLET | ORAL | Status: DC | PRN
  Administered 2022-03-06: 1 via ORAL

## 2022-03-06 MED ORDER — TRANEXAMIC ACID-NACL 1000-0.7 MG/100ML-% IV SOLN
1000.0000 mg | INTRAVENOUS | Status: AC
  Administered 2022-03-06: 1000 mg via INTRAVENOUS
  Filled 2022-03-06: qty 100

## 2022-03-06 MED ORDER — CEFAZOLIN SODIUM-DEXTROSE 2-4 GM/100ML-% IV SOLN
2.0000 g | INTRAVENOUS | Status: AC
  Administered 2022-03-06: 2 g via INTRAVENOUS
  Filled 2022-03-06: qty 100

## 2022-03-06 MED ORDER — SODIUM CHLORIDE (PF) 0.9 % IJ SOLN
INTRAMUSCULAR | Status: DC | PRN
  Administered 2022-03-06: 30 mL

## 2022-03-06 MED ORDER — LACTATED RINGERS IV BOLUS
500.0000 mL | Freq: Once | INTRAVENOUS | Status: AC
  Administered 2022-03-06: 500 mL via INTRAVENOUS

## 2022-03-06 MED ORDER — CHLORHEXIDINE GLUCONATE 0.12 % MT SOLN
15.0000 mL | Freq: Once | OROMUCOSAL | Status: AC
  Administered 2022-03-06: 15 mL via OROMUCOSAL

## 2022-03-06 MED ORDER — METHOCARBAMOL 500 MG IVPB - SIMPLE MED
500.0000 mg | Freq: Four times a day (QID) | INTRAVENOUS | Status: DC | PRN
  Administered 2022-03-06: 500 mg via INTRAVENOUS

## 2022-03-06 MED ORDER — POLYETHYLENE GLYCOL 3350 17 G PO PACK: 17.0000 g | PACK | Freq: Every day | ORAL | 0 refills | Status: DC | PRN

## 2022-03-06 MED ORDER — SODIUM CHLORIDE 0.9 % IR SOLN
Status: DC | PRN
  Administered 2022-03-06 (×2): 1000 mL

## 2022-03-06 MED ORDER — SODIUM CHLORIDE (PF) 0.9 % IJ SOLN
INTRAMUSCULAR | Status: AC
  Filled 2022-03-06: qty 50

## 2022-03-06 MED ORDER — METHOCARBAMOL 500 MG PO TABS: 500.0000 mg | ORAL_TABLET | Freq: Four times a day (QID) | ORAL | Status: DC | PRN

## 2022-03-06 MED ORDER — ONDANSETRON HCL 4 MG PO TABS: 4.0000 mg | ORAL_TABLET | Freq: Four times a day (QID) | ORAL | Status: DC | PRN

## 2022-03-06 MED ORDER — METOCLOPRAMIDE HCL 5 MG PO TABS: 5.0000 mg | ORAL_TABLET | Freq: Three times a day (TID) | ORAL | Status: DC | PRN

## 2022-03-06 MED ORDER — BUPIVACAINE-EPINEPHRINE 0.5% -1:200000 IJ SOLN
INTRAMUSCULAR | Status: AC
Start: 2022-03-06 — End: ?
  Filled 2022-03-06: qty 1

## 2022-03-06 MED ORDER — DOCUSATE SODIUM 100 MG PO CAPS: 100.0000 mg | ORAL_CAPSULE | Freq: Two times a day (BID) | ORAL | 1 refills | Status: DC

## 2022-03-06 MED ORDER — LACTATED RINGERS IV BOLUS
250.0000 mL | Freq: Once | INTRAVENOUS | Status: AC
  Administered 2022-03-06: 250 mL via INTRAVENOUS

## 2022-03-06 MED ORDER — LACTATED RINGERS IV SOLN: INTRAVENOUS | Status: DC

## 2022-03-06 MED ORDER — PHENYLEPHRINE HCL (PRESSORS) 10 MG/ML IV SOLN
INTRAVENOUS | Status: AC
  Filled 2022-03-06: qty 1

## 2022-03-06 MED ORDER — BUPIVACAINE IN DEXTROSE 0.75-8.25 % IT SOLN
INTRATHECAL | Status: DC | PRN
  Administered 2022-03-06: 2 mL via INTRATHECAL

## 2022-03-06 MED ORDER — ISOPROPYL ALCOHOL 70 % SOLN
Status: DC | PRN
  Administered 2022-03-06: 1 via TOPICAL

## 2022-03-06 MED ORDER — HYDROCODONE-ACETAMINOPHEN 10-325 MG PO TABS: 0.5000 | ORAL_TABLET | ORAL | 0 refills | Status: AC | PRN

## 2022-03-06 MED ORDER — ACETAMINOPHEN 325 MG PO TABS: 325.0000 mg | ORAL_TABLET | Freq: Four times a day (QID) | ORAL | Status: DC | PRN

## 2022-03-06 MED ORDER — PHENYLEPHRINE HCL-NACL 20-0.9 MG/250ML-% IV SOLN
INTRAVENOUS | Status: DC | PRN
  Administered 2022-03-06: 25 ug/min via INTRAVENOUS

## 2022-03-06 MED ORDER — ORAL CARE MOUTH RINSE: 15.0000 mL | Freq: Once | OROMUCOSAL | Status: AC

## 2022-03-06 MED ORDER — HYDROCODONE-ACETAMINOPHEN 5-325 MG PO TABS: 1.0000 | ORAL_TABLET | ORAL | Status: DC | PRN

## 2022-03-06 MED ORDER — FENTANYL CITRATE PF 50 MCG/ML IJ SOSY: 25.0000 ug | PREFILLED_SYRINGE | INTRAMUSCULAR | Status: DC | PRN

## 2022-03-06 MED ORDER — METOCLOPRAMIDE HCL 5 MG/ML IJ SOLN: 5.0000 mg | Freq: Three times a day (TID) | INTRAMUSCULAR | Status: DC | PRN

## 2022-03-06 MED ORDER — KETOROLAC TROMETHAMINE 30 MG/ML IJ SOLN
INTRAMUSCULAR | Status: DC | PRN
  Administered 2022-03-06: 30 mg

## 2022-03-06 MED ORDER — PHENYLEPHRINE 80 MCG/ML (10ML) SYRINGE FOR IV PUSH (FOR BLOOD PRESSURE SUPPORT)
PREFILLED_SYRINGE | INTRAVENOUS | Status: DC | PRN
  Administered 2022-03-06: 160 ug via INTRAVENOUS

## 2022-03-06 MED ORDER — ASPIRIN 81 MG PO CHEW: 81.0000 mg | CHEWABLE_TABLET | Freq: Two times a day (BID) | ORAL | 0 refills | Status: AC

## 2022-03-06 SURGICAL SUPPLY — 64 items
ADH SKN CLS APL DERMABOND .7 (GAUZE/BANDAGES/DRESSINGS) ×1
APL PRP STRL LF DISP 70% ISPRP (MISCELLANEOUS) ×1
BAG COUNTER SPONGE SURGICOUNT (BAG) ×1 IMPLANT
BAG DECANTER FOR FLEXI CONT (MISCELLANEOUS) IMPLANT
BAG SPEC THK2 15X12 ZIP CLS (MISCELLANEOUS)
BAG SPNG CNTER NS LX DISP (BAG) ×1
BAG ZIPLOCK 12X15 (MISCELLANEOUS) IMPLANT
CHLORAPREP W/TINT 26 (MISCELLANEOUS) ×3 IMPLANT
COVER PERINEAL POST (MISCELLANEOUS) ×3 IMPLANT
COVER SURGICAL LIGHT HANDLE (MISCELLANEOUS) ×3 IMPLANT
DERMABOND ADVANCED (GAUZE/BANDAGES/DRESSINGS) ×1
DERMABOND ADVANCED .7 DNX12 (GAUZE/BANDAGES/DRESSINGS) ×4 IMPLANT
DRAPE IMP U-DRAPE 54X76 (DRAPES) ×3 IMPLANT
DRAPE SHEET LG 3/4 BI-LAMINATE (DRAPES) ×9 IMPLANT
DRAPE STERI IOBAN 125X83 (DRAPES) ×3 IMPLANT
DRAPE U-SHAPE 47X51 STRL (DRAPES) ×6 IMPLANT
DRSG AQUACEL AG ADV 3.5X10 (GAUZE/BANDAGES/DRESSINGS) ×3 IMPLANT
ELECT REM PT RETURN 15FT ADLT (MISCELLANEOUS) ×3 IMPLANT
G7 VIT E NTRL LNR 36 SZG (Miscellaneous) ×1 IMPLANT
GAUZE SPONGE 4X4 12PLY STRL (GAUZE/BANDAGES/DRESSINGS) ×3 IMPLANT
GLOVE BIO SURGEON STRL SZ8.5 (GLOVE) ×6 IMPLANT
GLOVE BIOGEL M 7.0 STRL (GLOVE) ×3 IMPLANT
GLOVE BIOGEL PI IND STRL 7.5 (GLOVE) ×2 IMPLANT
GLOVE BIOGEL PI IND STRL 8.5 (GLOVE) ×2 IMPLANT
GLOVE BIOGEL PI INDICATOR 7.5 (GLOVE) ×1
GLOVE BIOGEL PI INDICATOR 8.5 (GLOVE) ×1
GLOVE SURG LX 7.5 STRW (GLOVE) ×2
GLOVE SURG LX STRL 7.5 STRW (GLOVE) ×4 IMPLANT
GOWN SPEC L3 XXLG W/TWL (GOWN DISPOSABLE) ×3 IMPLANT
GOWN STRL REUS W/ TWL XL LVL3 (GOWN DISPOSABLE) ×2 IMPLANT
GOWN STRL REUS W/TWL XL LVL3 (GOWN DISPOSABLE) ×2
HANDPIECE INTERPULSE COAX TIP (DISPOSABLE) ×2
HEAD FEM -3XOFST 36XMDLR (Head) IMPLANT
HEAD MODULAR 36MM (Head) ×2 IMPLANT
HOLDER FOLEY CATH W/STRAP (MISCELLANEOUS) ×3 IMPLANT
HOOD PEEL AWAY FLYTE STAYCOOL (MISCELLANEOUS) ×9 IMPLANT
KIT TURNOVER KIT A (KITS) ×1 IMPLANT
MANIFOLD NEPTUNE II (INSTRUMENTS) ×3 IMPLANT
MARKER SKIN DUAL TIP RULER LAB (MISCELLANEOUS) ×3 IMPLANT
NDL SAFETY ECLIPSE 18X1.5 (NEEDLE) ×2 IMPLANT
NDL SPNL 18GX3.5 QUINCKE PK (NEEDLE) ×2 IMPLANT
NEEDLE HYPO 18GX1.5 SHARP (NEEDLE) ×2
NEEDLE SPNL 18GX3.5 QUINCKE PK (NEEDLE) ×2 IMPLANT
PACK ANTERIOR HIP CUSTOM (KITS) ×3 IMPLANT
PENCIL SMOKE EVACUATOR (MISCELLANEOUS) IMPLANT
SAW OSC TIP CART 19.5X105X1.3 (SAW) ×3 IMPLANT
SEALER BIPOLAR AQUA 6.0 (INSTRUMENTS) ×3 IMPLANT
SET HNDPC FAN SPRY TIP SCT (DISPOSABLE) ×2 IMPLANT
SHELL ACET G7 4H 60 SZG (Shell) ×1 IMPLANT
SOLUTION PRONTOSAN WOUND 350ML (IRRIGATION / IRRIGATOR) ×3 IMPLANT
SPIKE FLUID TRANSFER (MISCELLANEOUS) ×3 IMPLANT
STAPLER VISISTAT 35W (STAPLE) ×1 IMPLANT
STEM FEM CMTLS PS 20 133D (Stem) ×1 IMPLANT
SUT MNCRL AB 3-0 PS2 18 (SUTURE) ×3 IMPLANT
SUT MON AB 2-0 CT1 36 (SUTURE) ×3 IMPLANT
SUT STRATAFIX PDO 1 14 VIOLET (SUTURE) ×2
SUT STRATFX PDO 1 14 VIOLET (SUTURE) ×1
SUT VIC AB 2-0 CT1 27 (SUTURE)
SUT VIC AB 2-0 CT1 TAPERPNT 27 (SUTURE) IMPLANT
SUTURE STRATFX PDO 1 14 VIOLET (SUTURE) ×2 IMPLANT
SYR 3ML LL SCALE MARK (SYRINGE) ×3 IMPLANT
TRAY FOLEY MTR SLVR 16FR STAT (SET/KITS/TRAYS/PACK) ×1 IMPLANT
TUBE SUCTION HIGH CAP CLEAR NV (SUCTIONS) ×3 IMPLANT
WATER STERILE IRR 1000ML POUR (IV SOLUTION) ×3 IMPLANT

## 2022-03-06 NOTE — Interval H&P Note (Signed)
History and Physical Interval Note:  03/06/2022 10:22 AM  Dean Wilkins  has presented today for surgery, with the diagnosis of Right hip osteoarthritis.  The various methods of treatment have been discussed with the patient and family. After consideration of risks, benefits and other options for treatment, the patient has consented to  Procedure(s): TOTAL HIP ARTHROPLASTY ANTERIOR APPROACH (Right) as a surgical intervention.  The patient's history has been reviewed, patient examined, no change in status, stable for surgery.  I have reviewed the patient's chart and labs.  Questions were answered to the patient's satisfaction.     Hilton Cork Charron Coultas

## 2022-03-06 NOTE — Transfer of Care (Signed)
Immediate Anesthesia Transfer of Care Note  Patient: Engineer, production  Procedure(s) Performed: TOTAL HIP ARTHROPLASTY ANTERIOR APPROACH (Right: Hip)  Patient Location: PACU  Anesthesia Type:Spinal  Level of Consciousness: awake and drowsy  Airway & Oxygen Therapy: Patient Spontanous Breathing and Patient connected to face mask oxygen  Post-op Assessment: Report given to RN and Post -op Vital signs reviewed and stable  Post vital signs: Reviewed and stable  Last Vitals:  Vitals Value Taken Time  BP 74/54 03/06/22 1405  Temp 36.8 C 03/06/22 1405  Pulse 56 03/06/22 1411  Resp 19 03/06/22 1411  SpO2 100 % 03/06/22 1411  Vitals shown include unvalidated device data.  Last Pain:  Vitals:   03/06/22 0932  TempSrc:   PainSc: 0-No pain      Patients Stated Pain Goal: 3 (10/93/23 5573)  Complications: No notable events documented.

## 2022-03-06 NOTE — Anesthesia Postprocedure Evaluation (Signed)
Anesthesia Post Note  Patient: Engineer, production  Procedure(s) Performed: TOTAL HIP ARTHROPLASTY ANTERIOR APPROACH (Right: Hip)     Patient location during evaluation: PACU Anesthesia Type: Spinal Level of consciousness: oriented and awake and alert Pain management: pain level controlled Vital Signs Assessment: post-procedure vital signs reviewed and stable Respiratory status: spontaneous breathing and respiratory function stable Cardiovascular status: blood pressure returned to baseline and stable Postop Assessment: no headache, no backache, no apparent nausea or vomiting, spinal receding and patient able to bend at knees Anesthetic complications: no   No notable events documented.  Last Vitals:  Vitals:   03/06/22 1530 03/06/22 1600  BP: 126/84 118/80  Pulse: (!) 49 (!) 51  Resp: 18 14  Temp:    SpO2: 100% 100%    Last Pain:  Vitals:   03/06/22 1530  TempSrc:   PainSc: 0-No pain                 Joetta Delprado,W. EDMOND

## 2022-03-06 NOTE — Discharge Instructions (Signed)
? ?Dr. Tallulah Hosman ?Joint Replacement Specialist ?[facility] ?[address] ?[place], [place] [address] ?[phone] ? ? ?TOTAL HIP REPLACEMENT POSTOPERATIVE DIRECTIONS ? ? ? ?Hip Rehabilitation, Guidelines Following Surgery  ? ?WEIGHT BEARING ?Weight bearing as tolerated with assist device (walker, cane, etc) as directed, use it as long as suggested by your surgeon or therapist, typically at least 4-6 weeks. ? ?The results of a hip operation are greatly improved after range of motion and muscle strengthening exercises. Follow all safety measures which are given to protect your hip. If any of these exercises cause increased pain or swelling in your joint, decrease the amount until you are comfortable again. Then slowly increase the exercises. Call your caregiver if you have problems or questions.  ? ?HOME CARE INSTRUCTIONS  ?Most of the following instructions are designed to prevent the dislocation of your new hip.  ?Remove items at home which could result in a fall. This includes throw rugs or furniture in walking pathways.  ?Continue medications as instructed at time of discharge. ?You may have some home medications which will be placed on hold until you complete the course of blood thinner medication. ?You may start showering once you are discharged home. Do not remove your dressing. ?Do not put on socks or shoes without following the instructions of your caregivers.   ?Sit on chairs with arms. Use the chair arms to help push yourself up when arising.  ?Arrange for the use of a toilet seat elevator so you are not sitting low.  ?Walk with walker as instructed.  ?You may resume a sexual relationship in one month or when given the OK by your caregiver.  ?Use walker as long as suggested by your caregivers.  ?You may put full weight on your legs and walk as much as is comfortable. ?Avoid periods of inactivity such as sitting longer than an hour when not asleep. This helps prevent blood  clots.  ?You may return to work once you are cleared by your surgeon.  ?Do not drive a car for 6 weeks or until released by your surgeon.  ?Do not drive while taking narcotics.  ?Wear elastic stockings for two weeks following surgery during the day but you may remove then at night.  ?Make sure you keep all of your appointments after your operation with all of your doctors and caregivers. You should call the office at the above phone number and make an appointment for approximately two weeks after the date of your surgery. ?Please pick up a stool softener and laxative for home use as long as you are requiring pain medications. ?ICE to the affected hip every three hours for 30 minutes at a time and then as needed for pain and swelling. Continue to use ice on the hip for pain and swelling from surgery. You may notice swelling that will progress down to the foot and ankle.  This is normal after surgery.  Elevate the leg when you are not up walking on it.   ?It is important for you to complete the blood thinner medication as prescribed by your doctor. ?Continue to use the breathing machine which will help keep your temperature down.  It is common for your temperature to cycle up and down following surgery, especially at night when you are not up moving around and exerting yourself.  The breathing machine keeps your lungs expanded and your temperature down. ? ?RANGE OF MOTION AND STRENGTHENING EXERCISES  ?These exercises are designed to help you   keep full movement of your hip joint. Follow your caregiver's or physical therapist's instructions. Perform all exercises about fifteen times, three times per day or as directed. Exercise both hips, even if you have had only one joint replacement. These exercises can be done on a training (exercise) mat, on the floor, on a table or on a bed. Use whatever works the best and is most comfortable for you. Use music or television while you are exercising so that the exercises are a  pleasant break in your day. This will make your life better with the exercises acting as a break in routine you can look forward to.  ?Lying on your back, slowly slide your foot toward your buttocks, raising your knee up off the floor. Then slowly slide your foot back down until your leg is straight again.  ?Lying on your back spread your legs as far apart as you can without causing discomfort.  ?Lying on your side, raise your upper leg and foot straight up from the floor as far as is comfortable. Slowly lower the leg and repeat.  ?Lying on your back, tighten up the muscle in the front of your thigh (quadriceps muscles). You can do this by keeping your leg straight and trying to raise your heel off the floor. This helps strengthen the largest muscle supporting your knee.  ?Lying on your back, tighten up the muscles of your buttocks both with the legs straight and with the knee bent at a comfortable angle while keeping your heel on the floor.  ? ?SKILLED REHAB INSTRUCTIONS: ?If the patient is transferred to a skilled rehab facility following release from the hospital, a list of the current medications will be sent to the facility for the patient to continue.  When discharged from the skilled rehab facility, please have the facility set up the patient's Home Health Physical Therapy prior to being released. Also, the skilled facility will be responsible for providing the patient with their medications at time of release from the facility to include their pain medication and their blood thinner medication. If the patient is still at the rehab facility at time of the two week follow up appointment, the skilled rehab facility will also need to assist the patient in arranging follow up appointment in our office and any transportation needs. ? ?POST-OPERATIVE OPIOID TAPER INSTRUCTIONS: ?It is important to wean off of your opioid medication as soon as possible. If you do not need pain medication after your surgery it is ok  to stop day one. ?Opioids include: ?Codeine, Hydrocodone(Norco, Vicodin), Oxycodone(Percocet, oxycontin) and hydromorphone amongst others.  ?Long term and even short term use of opiods can cause: ?Increased pain response ?Dependence ?Constipation ?Depression ?Respiratory depression ?And more.  ?Withdrawal symptoms can include ?Flu like symptoms ?Nausea, vomiting ?And more ?Techniques to manage these symptoms ?Hydrate well ?Eat regular healthy meals ?Stay active ?Use relaxation techniques(deep breathing, meditating, yoga) ?Do Not substitute Alcohol to help with tapering ?If you have been on opioids for less than two weeks and do not have pain than it is ok to stop all together.  ?Plan to wean off of opioids ?This plan should start within one week post op of your joint replacement. ?Maintain the same interval or time between taking each dose and first decrease the dose.  ?Cut the total daily intake of opioids by one tablet each day ?Next start to increase the time between doses. ?The last dose that should be eliminated is the evening dose.  ? ? ?MAKE   SURE YOU:  ?Understand these instructions.  ?Will watch your condition.  ?Will get help right away if you are not doing well or get worse. ? ?Pick up stool softner and laxative for home use following surgery while on pain medications. ?Do not remove your dressing. ?The dressing is waterproof--it is OK to take showers. ?Continue to use ice for pain and swelling after surgery. ?Do not use any lotions or creams on the incision until instructed by your surgeon. ?Total Hip Protocol. ? ?

## 2022-03-06 NOTE — Anesthesia Procedure Notes (Signed)
Spinal  Patient location during procedure: OR Start time: 03/06/2022 11:58 AM End time: 03/06/2022 12:03 PM Reason for block: surgical anesthesia Staffing Performed: anesthesiologist  Anesthesiologist: Roderic Palau, MD Performed by: Roderic Palau, MD Authorized by: Roderic Palau, MD   Preanesthetic Checklist Completed: patient identified, IV checked, risks and benefits discussed, surgical consent, monitors and equipment checked, pre-op evaluation and timeout performed Spinal Block Patient position: sitting Prep: DuraPrep Patient monitoring: cardiac monitor, continuous pulse ox and blood pressure Approach: midline Location: L3-4 Injection technique: single-shot Needle Needle type: Pencan  Needle gauge: 24 G Needle length: 9 cm Assessment Sensory level: T8 Events: CSF return Additional Notes Functioning IV was confirmed and monitors were applied. Sterile prep and drape, including hand hygiene and sterile gloves were used. The patient was positioned and the spine was prepped. The skin was anesthetized with lidocaine.  Free flow of clear CSF was obtained prior to injecting local anesthetic into the CSF.  The spinal needle aspirated freely following injection.  The needle was carefully withdrawn.  The patient tolerated the procedure well.

## 2022-03-06 NOTE — Op Note (Signed)
OPERATIVE REPORT  SURGEON: Rod Can, MD   ASSISTANT: Larene Pickett, PA-C.  PREOPERATIVE DIAGNOSIS: Right hip arthritis.   POSTOPERATIVE DIAGNOSIS: Right hip arthritis.   PROCEDURE: Right total hip arthroplasty, anterior approach.   IMPLANTS: Biomet Taperloc Complete Microplasty stem, size 20 x 151m, high offset. Biomet G7 OsseoTi Cup, size 60 mm. Biomet Vivacit-E liner, size 36 mm, G, neutral. Biomet metal head ball, size 36 - 3 mm.  ANESTHESIA:  MAC and Spinal  ESTIMATED BLOOD LOSS:-300 mL    ANTIBIOTICS: 2g Ancef.  DRAINS: None.  COMPLICATIONS: None.   CONDITION: PACU - hemodynamically stable.   BRIEF CLINICAL NOTE: BPanayiotis Rainvilleis a 76y.o. male with a long-standing history of Right hip arthritis. After failing conservative management, the patient was indicated for total hip arthroplasty. The risks, benefits, and alternatives to the procedure were explained, and the patient elected to proceed.  PROCEDURE IN DETAIL: Surgical site was marked by myself in the pre-op holding area. Once inside the operating room, spinal anesthesia was obtained, and a foley catheter was inserted. The patient was then positioned on the Hana table.  All bony prominences were well padded.  The hip was prepped and draped in the normal sterile surgical fashion.  A time-out was called verifying side and site of surgery. The patient received IV antibiotics within 60 minutes of beginning the procedure.   Bikini incision was made, and superficial dissection was performed lateral to the ASIS. The direct anterior approach to the hip was performed through the Hueter interval.  Lateral femoral circumflex vessels were treated with the Auqumantys. The anterior capsule was exposed and an inverted T capsulotomy was made. The femoral neck cut was made to the level of the templated cut.  A corkscrew was placed into the head and the head was removed.  The femoral head was found to have eburnated bone. The  head was passed to the back table and was measured. Pubofemoral ligament was released off of the calcar, taking care to stay on bone. Superior capsule was released from the greater trochanter, taking care to stay lateral to the posterior border of the femoral neck in order to preserve the short external rotators.   Acetabular exposure was achieved, and the pulvinar and labrum were excised. Sequential reaming of the acetabulum was then performed up to a size 59 mm reamer. A 60 mm cup was then opened and impacted into place at approximately 40 degrees of abduction and 20 degrees of anteversion. The final polyethylene liner was impacted into place and acetabular osteophytes were removed.    I then gained femoral exposure taking care to protect the abductors and greater trochanter.  This was performed using standard external rotation, extension, and adduction.  A cookie cutter was used to enter the femoral canal, and then the femoral canal finder was placed.  Sequential broaching was performed up to a size 20.  Calcar planer was used on the femoral neck remnant.  I placed a high offset neck and a trial head ball.  The hip was reduced.  Leg lengths and offset were checked fluoroscopically.  The hip was dislocated and trial components were removed.  The final implants were placed, and the hip was reduced.  Fluoroscopy was used to confirm component position and leg lengths.  At 90 degrees of external rotation and full extension, the hip was stable to an anterior directed force.   The wound was copiously irrigated with Prontosan solution and normal saline using pule lavage.  Marcaine solution was  injected into the periarticular soft tissue.  The wound was closed in layers using #1 Vicryl and V-Loc for the fascia, 2-0 Vicryl for the subcutaneous fat, 2-0 Monocryl for the deep dermal layer, and staples + Dermabond for the skin.  Once the glue was fully dried, an Aquacell Ag dressing was applied.  The patient was  transported to the recovery room in stable condition.  Sponge, needle, and instrument counts were correct at the end of the case x2.  The patient tolerated the procedure well and there were no known complications.  Please note that a surgical assistant was a medical necessity for this procedure to perform it in a safe and expeditious manner. Assistant was necessary to provide appropriate retraction of vital neurovascular structures, to prevent femoral fracture, and to allow for anatomic placement of the prosthesis.

## 2022-03-06 NOTE — Evaluation (Signed)
Physical Therapy Evaluation Patient Details Name: Dean Wilkins MRN: [id number] DOB: 12/17/1945 Today's Date: 03/06/2022  History of Present Illness  Pt is a 76yo male presenting s/p R-THA, AA on 03/06/22. PMH: CAD s/p CABG, angina, HLD, HTN, PVC, "neck surgery", L-THA 2021  Clinical Impression  Dean Wilkins is a 76 y.o. male POD 0 s/p R-THA, AA. Patient reports independence with mobility at baseline. Patient is now limited by functional impairments (see PT problem list below) and requires min guard for transfers and gait with RW. Patient was able to ambulate 80 feet with RW and min guard and cues for safe walker management. Patient educated on safe sequencing for stair mobility and verbalized safe guarding position for people assisting with mobility. Patient instructed in exercises to facilitate ROM and circulation. Patient will benefit from continued skilled PT interventions to address impairments and progress towards PLOF. Patient has met mobility goals at adequate level for discharge home; will continue to follow if pt continues acute stay to progress towards Mod I goals.        Recommendations for follow up therapy are one component of a multi-disciplinary discharge planning process, led by the attending physician.  Recommendations may be updated based on patient status, additional functional criteria and insurance authorization.  Follow Up Recommendations Follow physician's recommendations for discharge plan and follow up therapies      Assistance Recommended at Discharge Set up Supervision/Assistance  Patient can return home with the following  A little help with walking and/or transfers;A little help with bathing/dressing/bathroom;Assistance with cooking/housework;Help with stairs or ramp for entrance;Assist for transportation    Equipment Recommendations None recommended by PT  Recommendations for Other Services       Functional Status Assessment Patient has had a recent decline  in their functional status and demonstrates the ability to make significant improvements in function in a reasonable and predictable amount of time.     Precautions / Restrictions Precautions Precautions: Fall Restrictions Weight Bearing Restrictions: No Other Position/Activity Restrictions: wbat      Mobility  Bed Mobility Overal bed mobility: Modified Independent             General bed mobility comments: increased time    Transfers Overall transfer level: Needs assistance Equipment used: Rolling walker (2 wheels) Transfers: Sit to/from Stand Sit to Stand: Min guard, From elevated surface           General transfer comment: For safety only from stretcher, no physical assist required    Ambulation/Gait Ambulation/Gait assistance: Supervision, Min guard Gait Distance (Feet): 80 Feet Assistive device: Rolling walker (2 wheels) Gait Pattern/deviations: Step-to pattern, Step-through pattern Gait velocity: decreased     General Gait Details: Pt ambulated with RW and min guard progressed to supervision, no physical assist required or overt LOB noted. Pt progressed from step-to --> step-through pattern toward end of ambulation task.  Stairs Stairs: Yes Stairs assistance: Min guard Stair Management: One rail Right, Step to pattern, Sideways Number of Stairs: 2 General stair comments: Pt demonstrated safe stair mobility with VCs for sequencing, no overt LOB noted.  Wheelchair Mobility    Modified Rankin (Stroke Patients Only)       Balance Overall balance assessment: Needs assistance Sitting-balance support: No upper extremity supported, Feet unsupported Sitting balance-Leahy Scale: Normal     Standing balance support: Reliant on assistive device for balance, During functional activity, Bilateral upper extremity supported Standing balance-Leahy Scale: Poor  Pertinent Vitals/Pain Pain Assessment Pain Assessment:  0-10 Pain Score: 2  Pain Location: right hip Pain Descriptors / Indicators: Operative site guarding Pain Intervention(s): Limited activity within patient's tolerance, Monitored during session, Repositioned, Ice applied    Home Living Family/patient expects to be discharged to:: Private residence Living Arrangements: Spouse/significant other Available Help at Discharge: Family;Available 24 hours/day Type of Home: House Home Access: Stairs to enter Entrance Stairs-Rails: Right Entrance Stairs-Number of Steps: 1   Home Layout: One level Home Equipment: Cane - single Barista (2 wheels)      Prior Function Prior Level of Function : Independent/Modified Independent;Driving             Mobility Comments: ind ADLs Comments: ind     Hand Dominance   Dominant Hand: Right    Extremity/Trunk Assessment   Upper Extremity Assessment Upper Extremity Assessment: Overall WFL for tasks assessed    Lower Extremity Assessment Lower Extremity Assessment: RLE deficits/detail;LLE deficits/detail RLE Deficits / Details: MMT ank DF/PF 5/5 RLE Sensation: WNL LLE Deficits / Details: MMT ank DF/PF 5/5 LLE Sensation: WNL    Cervical / Trunk Assessment Cervical / Trunk Assessment: Neck Surgery  Communication   Communication: No difficulties  Cognition Arousal/Alertness: Awake/alert Behavior During Therapy: WFL for tasks assessed/performed Overall Cognitive Status: Within Functional Limits for tasks assessed                                          General Comments General comments (skin integrity, edema, etc.): wife and sister present    Exercises Total Joint Exercises Ankle Circles/Pumps: AROM, Both, 10 reps Quad Sets: AROM, Right, Other reps (comment) (3) Short Arc Quad: AROM, Right, Other reps (comment) (3) Heel Slides: AROM, Right, 5 reps, AAROM Hip ABduction/ADduction: AAROM, Right, Both, Other (comment) (Standing: edcuated not performed) Long  Arc Quad: Other (comment) (edcuated not performed) Marching in Standing: Other (comment) (edcuated not performed) Standing Hip Extension: Other (comment) (edcuated not performed)   Assessment/Plan    PT Assessment Patient needs continued PT services  PT Problem List Decreased strength;Decreased range of motion;Decreased activity tolerance;Decreased balance;Decreased mobility;Decreased coordination;Pain       PT Treatment Interventions DME instruction;Gait training;Stair training;Functional mobility training;Therapeutic activities;Therapeutic exercise;Balance training;Neuromuscular re-education;Patient/family education    PT Goals (Current goals can be found in the Care Plan section)  Acute Rehab PT Goals Patient Stated Goal: Move around without pain, get back to gardening PT Goal Formulation: With patient Time For Goal Achievement: 03/13/22 Potential to Achieve Goals: Good    Frequency 7X/week     Co-evaluation               AM-PAC PT "6 Clicks" Mobility  Outcome Measure Help needed turning from your back to your side while in a flat bed without using bedrails?: None Help needed moving from lying on your back to sitting on the side of a flat bed without using bedrails?: None Help needed moving to and from a bed to a chair (including a wheelchair)?: A Little Help needed standing up from a chair using your arms (e.g., wheelchair or bedside chair)?: A Little Help needed to walk in hospital room?: A Little Help needed climbing 3-5 steps with a railing? : A Little 6 Click Score: 20    End of Session Equipment Utilized During Treatment: Gait belt Activity Tolerance: Patient tolerated treatment well;No increased pain Patient left: in chair;with call bell/phone  within reach;with family/visitor present Nurse Communication: Mobility status PT Visit Diagnosis: Pain;Difficulty in walking, not elsewhere classified (R26.2) Pain - Right/Left: Right Pain - part of body: Hip     Time: 3702-3017 PT Time Calculation (min) (ACUTE ONLY): 40 min   Charges:   PT Evaluation $PT Eval Low Complexity: 1 Low PT Treatments $Gait Training: 8-22 mins $Therapeutic Exercise: 8-22 mins        Coolidge Breeze, PT, DPT [facility] Office: [phone] Pager: [phone]  Coolidge Breeze 03/06/2022, 7:12 PM

## 2022-03-07 ENCOUNTER — Encounter (HOSPITAL_COMMUNITY): Payer: Self-pay | Admitting: Orthopedic Surgery

## 2022-03-08 NOTE — Progress Notes (Signed)
Spoke with patient and patient states that he was prescribed a salt tablet through his PCP, started taking pills on Thursday of last week and stopped on Tuesday the day before surgery which was scheduled on 03/06/22,

## 2022-03-12 ENCOUNTER — Ambulatory Visit: Payer: Medicare Other | Admitting: Cardiology

## 2022-04-18 ENCOUNTER — Ambulatory Visit: Payer: Medicare Other | Admitting: Cardiology

## 2022-04-18 ENCOUNTER — Encounter: Payer: Self-pay | Admitting: Cardiology

## 2022-04-18 VITALS — BP 108/69 | HR 77 | Temp 97.2°F | Resp 16 | Ht 72.0 in | Wt 239.0 lb

## 2022-04-18 DIAGNOSIS — I2510 Atherosclerotic heart disease of native coronary artery without angina pectoris: Secondary | ICD-10-CM

## 2022-04-18 DIAGNOSIS — E7800 Pure hypercholesterolemia, unspecified: Secondary | ICD-10-CM

## 2022-04-18 DIAGNOSIS — G4733 Obstructive sleep apnea (adult) (pediatric): Secondary | ICD-10-CM

## 2022-04-18 DIAGNOSIS — Z951 Presence of aortocoronary bypass graft: Secondary | ICD-10-CM

## 2022-04-18 DIAGNOSIS — Z87891 Personal history of nicotine dependence: Secondary | ICD-10-CM

## 2022-04-18 DIAGNOSIS — I10 Essential (primary) hypertension: Secondary | ICD-10-CM

## 2022-04-18 MED ORDER — NITROGLYCERIN 0.4 MG SL SUBL: 0.4000 mg | SUBLINGUAL_TABLET | SUBLINGUAL | 0 refills | Status: AC | PRN

## 2022-04-18 NOTE — Progress Notes (Signed)
[name] Date of Birth: September 19, 1945 MRN: [id number] Primary Care Provider:Baker, Shelly Flatten, MD Former Cardiology Providers: Jeri Lager, APRN, FNP-C Primary Cardiologist: Rex Kras, DO, [facility] (established care 03/08/2020)  Date: 04/18/22 Last Office Visit: 03/12/2021  Chief Complaint  Patient presents with   Coronary Artery Disease   Follow-up   HPI  Dean Wilkins is a 76 y.o.  male whose past medical history and cardiovascular risk factors include: Hypertension, established coronary disease prior CABG in 1999 (LIMA to LAD, SVG to OM, SVG to RCA), hyperlipidemia, OSA on CPAP, Hx of SVT, advanced age.  Known history of CAD with prior bypass in 1999 presents today for 1 year follow-up visit.  Since last office visit patient underwent right hip replacement and has done well postoperatively without any cardiovascular complications.  His healing is slow to progress but recovering as expected according to him.  Since last office visit he has not had any anginal discomfort or heart failure symptoms.  No hospitalizations or urgent care visits for cardiovascular symptoms either.  Patient is accompanied by his wife who also provides collateral history as part of today's encounter.  FUNCTIONAL STATUS:  Limited for now secondary to recent hip surgery  ALLERGIES: Allergies  Allergen Reactions   Etodolac Other (See Comments)    Unknown allergic reaction.   Lipitor [Atorvastatin] Other (See Comments)    Muscle aches/leg pains.   Niaspan [Niacin Er] Other (See Comments)    Turned red / flushed    Tape Rash    Paper tape    MEDICATION LIST PRIOR TO VISIT: Current Outpatient Medications on File Prior to Visit  Medication Sig Dispense Refill   aspirin (ASPIRIN CHILDRENS) 81 MG chewable tablet Chew 1 tablet (81 mg total) by mouth 2 (two) times daily with a meal. 90 tablet 0   isosorbide mononitrate (IMDUR) 30 MG 24 hr tablet Take 30 mg by mouth every evening.      lisinopril  (ZESTRIL) 20 MG tablet Take 20 mg by mouth daily.     metoprolol succinate (TOPROL-XL) 25 MG 24 hr tablet Take 1 tablet (25 mg total) by mouth in the morning. 90 tablet 3   simvastatin (ZOCOR) 20 MG tablet Take 20 mg by mouth daily.     triamterene-hydrochlorothiazide (MAXZIDE-25) 37.5-25 MG tablet Take 1 tablet by mouth 4 (four) times a week.      No current facility-administered medications on file prior to visit.   PAST MEDICAL HISTORY: Past Medical History:  Diagnosis Date   Aortic sclerosis    Atherosclerosis of native coronary artery of native heart with angina pectoris ([place]) 03/02/2019   Coronary artery disease    Dysrhythmia    PVC's, V-tach   Exogenous obesity    Heart murmur    History of coronary artery bypass graft x 3 03/02/2019   History of PTCA 03/02/2019   HLD (hyperlipidemia) 03/02/2019   HTN (hypertension) 03/02/2019   Hyperlipidemia    Hypertension    Left ventricular hypertrophy    NSVT (nonsustained ventricular tachycardia) ([place]) 03/02/2019   PVC (premature ventricular contraction) 03/02/2019   Sleep apnea     PAST SURGICAL HISTORY: Past Surgical History:  Procedure Laterality Date   CORONARY ARTERY BYPASS GRAFT  1999   DES stent to mid native RCA /DES stent to mild RCA  2008/2009   EYE SURGERY  2019,2020   bilateral cataract surgery with lens implant   LEFT HEART CATH AND CORS/GRAFTS ANGIOGRAPHY N/A 05/27/2017   Procedure: LEFT HEART CATH AND CORS/GRAFTS ANGIOGRAPHY;  Surgeon: Adrian Prows, MD;  Location: [facility];  Service: Cardiovascular;  Laterality: N/A;   NECK SURGERY  1991   NM MYOVIEW LTD     2013   TONSILLECTOMY     TOTAL HIP ARTHROPLASTY Left 09/16/2019   Procedure: TOTAL HIP ARTHROPLASTY ANTERIOR APPROACH;  Surgeon: Rod Can, MD;  Location: [facility];  Service: Orthopedics;  Laterality: Left;   TOTAL HIP ARTHROPLASTY     TOTAL HIP ARTHROPLASTY Right 03/06/2022   Procedure: TOTAL HIP ARTHROPLASTY ANTERIOR APPROACH;  Surgeon:  Rod Can, MD;  Location: [facility];  Service: Orthopedics;  Laterality: Right;    FAMILY HISTORY: The patient's family history includes Heart attack in his brother.   SOCIAL HISTORY:  The patient  reports that he quit smoking about 53 years ago. His smoking use included cigarettes. He has a 0.25 pack-year smoking history. He uses smokeless tobacco. He reports that he does not currently use alcohol. He reports that he does not use drugs.  Review of Systems  Cardiovascular:  Negative for chest pain, claudication, dyspnea on exertion, irregular heartbeat, leg swelling, near-syncope, orthopnea, palpitations, paroxysmal nocturnal dyspnea and syncope.  Respiratory:  Negative for shortness of breath.   Hematologic/Lymphatic: Negative for bleeding problem.  Musculoskeletal:  Negative for muscle cramps and myalgias.  Neurological:  Negative for dizziness and light-headedness.    PHYSICAL EXAM:    04/18/2022    2:49 PM 03/06/2022    6:00 PM 03/06/2022    5:30 PM  Vitals with BMI  Height $Remov'6\' 0"'uWoaZk$     Weight 239 lbs    BMI 41.93    Systolic 790 240 973  Diastolic 69 80 82  Pulse 77     CONSTITUTIONAL: Well-developed and well-nourished. No acute distress.  SKIN: Skin is warm and dry. No rash noted. No cyanosis. No pallor. No jaundice HEAD: Normocephalic and atraumatic.  EYES: No scleral icterus  MOUTH/THROAT: Moist oral membranes.  NECK: No JVD present. No thyromegaly noted. No carotid bruits  LYMPHATIC: No visible cervical adenopathy.  CHEST Normal respiratory effort. No intercostal retractions  LUNGS: Clear to auscultation bilaterally.  No stridor. No wheezes. No rales.  CARDIOVASCULAR: Regular rate and rhythm, positive Z3-G9, soft holosystolic murmur heard at the apex, no gallops or rubs. ABDOMINAL: Obese, soft, nontender, nondistended, positive bowel sounds in all 4 quadrants.  No apparent ascites.  EXTREMITIES: No peripheral edema, warm to touch bilaterally, palpable DP and PT pulses  bilaterally. HEMATOLOGIC: No significant bruising NEUROLOGIC: Oriented to person, place, and time. Nonfocal. Normal muscle tone.  PSYCHIATRIC: Normal mood and affect. Normal behavior. Cooperative No significant change in physical examination since last office encounter.  CARDIAC DATABASE: EKG: 04/18/2022: Normal sinus rhythm, 75 bpm, nonspecific T wave abnormality, without underlying ischemia injury pattern.  Echocardiogram: 04/30/2017: LVEF 40-45%.    02/23/2021: Normal LV systolic function with visual EF 55-60%. Left ventricle cavity is normal in size. Mild left ventricular hypertrophy. Normal global wall motion. Abnormal septal wall motion due to post-op CABG. Normal diastolic filling pattern, normal LAP. Mild (Grade I) aortic regurgitation. Mild (Grade I) mitral regurgitation. Mild tricuspid regurgitation. No evidence of pulmonary hypertension. Mild to moderate pulmonic regurgitation.  Stress Testing:  Nuclear stress test  [04/25/2017]:  1. Prognostically, this is a moderate risk study. 2. Resting EKG shows NSR, frequent PVC. Stress EKG no change. V- Bigeminy and Quadragemini persisted. No ST-T changes. 3. The gated study showed the ejection fraction was 40-45%. There is lateral wall akinesis. There is a moderate-sized scar in the  lateral wall extending from the mid ventricle towards the apex including the apical anterior wall. In addition there is very mild peri-infarct ischemia mostly towards mid anterolateral wall  Heart Catheterization: Coronary angiogram 05/27/2017: Mild global hypokinesis, EF 45%.  No significant mitral regurgitation.  Normal LVEDP.  Left main normal.  LAD occluded in the proximal segment.  Distal LAD supplied by LIMA, LIMA to LAD widely patent.  Native LAD diffusely diseased and small caliber.  Circumflex coronary artery proximal segment 40% stenosis, moderate-sized OM1 with high-grade complex acute angle 95% stenosis.  Large OM 2 with mild 30% proximal stenosis.   RCA previously placed stents show about a 30% to 40% in-stent restenosis especially in the proximal segment but does not appear angiographically significant.   Recommendation: Patient's nuclear stress test is probably abnormal due to a moderate-sized OM1 high-grade stenosis.  However the anatomy is not very conducive for angioplasty, would recommend medical therapy.  Overall low risk for sudden cardiac death. Complex coronary anatomy, left radial access utilized, AL to catheter utilized to engage left main.  160 mL contrast utilized.  There was no immediate complication.  He will be discharged home today with outpatient follow-up.  Abdominal Ultrasound  [03/21/2015]: No AAA observed. Mild atheresclerosis.  LABORATORY DATA: External Labs: Collected: 04/16/2022 [facility] Sodium 133, potassium 4.1, chloride 98, bicarb 27, BUN 12, creatinine 1.04. eGFR 74. Hemoglobin 14.3, hematocrit 14.3%   IMPRESSION:    ICD-10-CM   1. Atherosclerosis of native coronary artery of native heart without angina pectoris  I25.10 EKG 12-Lead    nitroGLYCERIN (NITROSTAT) 0.4 MG SL tablet    2. History of coronary artery bypass graft x 3  Z95.1     3. Essential hypertension  I10     4. Obstructive sleep apnea on CPAP  G47.33    Z99.89     5. Pure hypercholesterolemia  E78.00     6. Former smoker  Z87.891         RECOMMENDATIONS: Dean Wilkins is a 76 y.o. male whose past medical history and cardiovascular risk factors include: Hypertension, established coronary disease prior CABG in 1999 (LIMA to LAD, SVG to OM, SVG to RCA), hyperlipidemia, OSA on CPAP, Hx of SVT, advanced age.  Atherosclerosis of native coronary artery of native heart without angina pectoris / History of coronary artery bypass graft x 3 Free of angina pectoris. EKG: Nonischemic. No use of sublingual nitroglycerin tablets since last office encounter. Nitro tablets refilled. Last echocardiogram  noted mild improvement in LVEF from 40-45% to 55-60%. In the past we have discussed undergoing stress test for disease monitoring; however, patient wants to defer that as he is asymptomatic. Re emphasized the importance of improving his modifiable cardiovascular risk factors  Essential hypertension Home blood pressures are well controlled. Medications reconciled. No changes warranted at this time. Outside labs from [place] from 04/16/2022 independently reviewed.  He is noted to have mild degree of hyponatremia 133.  It is likely due to his triamterene/hydrochlorothiazide.  I recommended transitioning him to only hydrochlorothiazide and to have his labs rechecked.  However, he would like to continue the same and will discuss with PCP.  Pure hypercholesterolemia Currently on statin therapy. Does not recall the last when the lipids were checked. I do not have a recent lipid profile for review. Patient states that he will discuss this further with PCP  FINAL MEDICATION LIST END OF ENCOUNTER: Meds ordered this encounter  Medications   nitroGLYCERIN (NITROSTAT) 0.4  MG SL tablet    Sig: Place 1 tablet (0.4 mg total) under the tongue every 5 (five) minutes as needed for chest pain. If you require more than two tablets five minutes apart go to the nearest ER via EMS.    Dispense:  30 tablet    Refill:  0     Current Outpatient Medications:    aspirin (ASPIRIN CHILDRENS) 81 MG chewable tablet, Chew 1 tablet (81 mg total) by mouth 2 (two) times daily with a meal., Disp: 90 tablet, Rfl: 0   isosorbide mononitrate (IMDUR) 30 MG 24 hr tablet, Take 30 mg by mouth every evening. , Disp: , Rfl:    lisinopril (ZESTRIL) 20 MG tablet, Take 20 mg by mouth daily., Disp: , Rfl:    metoprolol succinate (TOPROL-XL) 25 MG 24 hr tablet, Take 1 tablet (25 mg total) by mouth in the morning., Disp: 90 tablet, Rfl: 3   nitroGLYCERIN (NITROSTAT) 0.4 MG SL tablet, Place 1 tablet (0.4 mg total) under the tongue  every 5 (five) minutes as needed for chest pain. If you require more than two tablets five minutes apart go to the nearest ER via EMS., Disp: 30 tablet, Rfl: 0   simvastatin (ZOCOR) 20 MG tablet, Take 20 mg by mouth daily., Disp: , Rfl:    triamterene-hydrochlorothiazide (MAXZIDE-25) 37.5-25 MG tablet, Take 1 tablet by mouth 4 (four) times a week. , Disp: , Rfl:   Orders Placed This Encounter  Procedures   EKG 12-Lead   --Continue cardiac medications as reconciled in final medication list. --Return in about 1 year (around 04/19/2023) for Follow up, CAD. Or sooner if needed. --Continue follow-up with your primary care physician regarding the management of your other chronic comorbid conditions.  Patient's questions and concerns were addressed to his satisfaction. He voices understanding of the instructions provided during this encounter.   This note was created using a voice recognition software as a result there may be grammatical errors inadvertently enclosed that do not reflect the nature of this encounter. Every attempt is made to correct such errors.  Rex Kras, [place], [facility]  Pager: [phone] Office: [phone]

## 2022-04-25 ENCOUNTER — Other Ambulatory Visit: Payer: Self-pay | Admitting: Cardiology

## 2022-04-25 DIAGNOSIS — Z951 Presence of aortocoronary bypass graft: Secondary | ICD-10-CM

## 2022-04-25 DIAGNOSIS — I2510 Atherosclerotic heart disease of native coronary artery without angina pectoris: Secondary | ICD-10-CM

## 2023-01-10 ENCOUNTER — Encounter: Payer: Self-pay | Admitting: Cardiology

## 2023-01-19 ENCOUNTER — Encounter: Payer: Self-pay | Admitting: Cardiology

## 2023-04-09 LAB — LAB REPORT - SCANNED
A1c: 6.6
EGFR: 77

## 2023-04-18 ENCOUNTER — Ambulatory Visit: Payer: Medicare Other | Admitting: Cardiology

## 2023-05-06 ENCOUNTER — Encounter: Payer: Self-pay | Admitting: Physician Assistant

## 2023-05-06 NOTE — Progress Notes (Unsigned)
Cardiology Office Note    Date:  05/08/2023  ID:  Ledarrius Beauchaine, DOB Apr 16, 1946, MRN [id number] PCP:  Judge Stall, MD  Cardiologist:  Tessa Lerner, DO  Electrophysiologist:  None   Chief Complaint: f/u CAD  History of Present Illness: .    Christoper Pfiffner is a 77 y.o. 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 He had remote CABG in 1999 as outlined then PCIs to the RCA to follow. The patient thinks he had 7 total stents but details unclear. He was previously known to have occluded graft to right coronary and also obtuse marginal. In 2018 he had evaluation for fatigue and pseudobradycardia with frequent PVCs, ventricular bigeminy, couplets and also episode of NSVT. Beta blocker was actually reduced due to fatigue. EF was reported to be 40-45% at that time by echo. He underwent stress testing which was abnormal prompting cardiac cath with native coronary disease, patent LIMA-LAD and high grade OM1 stenosis felt to be the culprit, but not amenable to PCI, recommended for medical management. Last echo 2022 showed EF 55-60%, mild LVH, abnormal septal motion post CABG, mild AI/MR, mild-moderate PR.   He is seen for follow-up today with his wife doing great from a cardiac standpoint without CP, SOB, palpitations, dizziness, syncope or edema. He has no acute complaints. Reports home BPs 120s/70s. He clarifies he is taking a baby ASA and never stopped (had fallen off list, added back today). He also reports he recently followed up with [facility] for his OSA and they identified that he had a 3cm aortic aneurysm that they recommended to follow up in a year. We will try to get a copy  of those records. Initial BP 134/82, recheck 127/76 by me. He also clarifies hydrochlorothiazide stopped in the past due to low sodium level.  Labwork independently reviewed: 03/2023 scan Hgb 13.9, plt 154, A1c 6.6, K 4.2, Cr 1.0, LFTs ok, LDL 49, trig 102 2009 TSH wnl  ROS: .    Please see the history of present illness.  All other systems are reviewed and otherwise negative.  Studies Reviewed: [facility] Kitchen    EKG:  EKG is ordered today, personally reviewed, demonstrating NSR 68bpm, first degree AVB, low voltage QRS, nonspecific STTW changes I, avL. No change from 2023.  CV Studies: Cardiac studies reviewed are outlined and summarized above. Otherwise please see EMR for full report.  Per Dr. Verl Dicker note 2018 Holter Monitor 48 hours 04/03/2017: Symptomatic transmission of fatigue and weakness revealed normal sinus rhythm. There were frequent PVCs, brief episodes of ventricle bigeminy, there was a 9 beat run of ventricular tachycardia at a rate of 106 bpm noted at 12:30 AM. Total ventricular ectopy consisted of 38,000 beats in 48 hours with 300 PVCs in triplets, 5000 in couplets, 28,000 isolated PVC. Occasional PACs and atrial couplets were evident. No atrial fibrillation.   2018 echo Echocardiogram  [04/30/2017]: Left ventricle cavity is minimally dilated at 5.3 cm and volume of . Mild concentric hypertrophy of the left ventricle. Doppler evidence of grade I (impaired) diastolic dysfunction. Left ventricle regional wall motion findings: mid and apical inferior, inferolateral akinesis. LV systolic function is mild to moderately reduced. Visual EF is  40-45%. Left atrial cavity is severely dilated at 5.1 cm. Mild (Grade I) aortic regurgitation. Mild (Grade I) mitral regurgitation. Mild tricuspid regurgitation. No evidence of pulmonary hypertension.  2022 echo Echocardiogram 02/23/2021:  Normal LV systolic function with visual EF 55-60%. Left ventricle cavity  is normal in size. Mild left  ventricular hypertrophy. Normal global wall  motion. Abnormal septal wall motion due to post-op CABG. Normal diastolic  filling pattern, normal LAP.  Mild (Grade I) aortic regurgitation.  Mild (Grade I) mitral regurgitation.  Mild tricuspid regurgitation. No evidence of pulmonary hypertension.  Mild to moderate pulmonic regurgitation.  Compared to study 04/30/2017 LVEF improved from 40-45% to 55-60% otherwise  no significant change.   Current Reported Medications:.    Current Meds  Medication Sig   aspirin EC 81 MG tablet Take 81 mg by mouth daily. Swallow whole.   FARXIGA 10 MG TABS tablet Take 10 mg by mouth every morning.   isosorbide mononitrate (IMDUR) 30 MG 24 hr tablet Take 30 mg by mouth every evening.    lisinopril (ZESTRIL) 20 MG tablet Take 20 mg by mouth daily.   metoprolol succinate (TOPROL-XL) 25 MG 24 hr tablet TAKE 1 TABLET (25 MG TOTAL) BY MOUTH IN THE MORNING.   nitroGLYCERIN (NITROSTAT) 0.4 MG SL tablet Place 1 tablet (0.4 mg total) under the tongue every 5 (five) minutes as needed for chest pain. If you require more than two tablets five minutes apart go to the nearest ER via EMS.   simvastatin (ZOCOR) 20 MG tablet Take 20 mg by mouth daily.    Physical Exam:    VS:  BP 127/76   Pulse 70   Ht 6\' 1"  (1.854 m)   Wt 229 lb 6.4 oz (104.1 kg)   SpO2 97%   BMI 30.27 kg/m    Wt Readings from Last 3 Encounters:  05/08/23 229 lb 6.4 oz (104.1 kg)  04/18/22 239 lb (108.4 kg)  03/06/22 237 lb (107.5 kg)    GEN: Well nourished, well developed in no acute distress NECK: No JVD; No carotid bruits CARDIAC: RRR, no murmurs, rubs, gallops RESPIRATORY:  Clear to auscultation without rales, wheezing or rhonchi  ABDOMEN: Soft, non-tender, non-distended EXTREMITIES:  No edema; No acute deformity   Asessement and Plan:.    1. CAD, HLD - doing well without any angina. Continue ASA, Toprol. We discussed guideline directed transition to moderate-high intensity statin given  his CABG and multiple prior stents, he is amenable to try switching. Will stop simvastatin and change to rosuvastatin to 20mg  daily. Recheck CMET/lipids in 3 months. He had myalgias with atorvastatin - he will let us know if there are any issues on rosuvastatin in which case we can switch back to simvastatin.  2. Prior HFmrEF with improved LVEF - appears euvolemic. Etiology of cardiomyopathy at that time unclear to me, potentially driven by ectopy or coronary disease. Continue BB, lisinopril, Farxiga. Update echo as below given mild-moderate range PR on study in 2022. Discussed monitoring BP at home and notifying for readings tending to run >130 systolic. He reports home readings 120s/70s.  3. PVCs/NSVT, also question of PSVT - quiescent by exam, continue metoprolol. I do not see prior baseline TSH or Mg so will obtain. Recent K was normal.  4. Mild AI, mild MR, mild-moderate PR - update echocardiogram given mild-moderate PR on prior study. No significant murmur on exam.   5. Aortic aneurysm NOS - he describes a 3cm aortic aneurysm by recent CT at [facility].  It sounds like they recommended repeat screening in a year. We do not have further details. We will try to obtain copy of the CT report to help guide next recommendations. We discussed standard aneurysm precautions including lifting restrictions, avoidance of fluoroquinolones, family screening, and BP control. 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 Guidelines traditionally recommend to repeat CT in 6 months to trend velocity of change. I will send phone note with recommendation to repeat study as dedicated CTA of the aorta at the end of December 2024/Jan /2025 if patient would like [place] to follow.     Disposition: F/u with Dr. Odis Hollingshead in 1 year, sooner if needed.  Signed, Laurann Montana, PA-C

## 2023-05-08 ENCOUNTER — Other Ambulatory Visit: Payer: Self-pay | Admitting: *Deleted

## 2023-05-08 ENCOUNTER — Telehealth: Payer: Self-pay | Admitting: Physician Assistant

## 2023-05-08 ENCOUNTER — Ambulatory Visit: Payer: Medicare Other | Attending: Physician Assistant | Admitting: Physician Assistant

## 2023-05-08 ENCOUNTER — Encounter: Payer: Self-pay | Admitting: Physician Assistant

## 2023-05-08 VITALS — BP 127/76 | HR 70 | Ht 73.0 in | Wt 229.4 lb

## 2023-05-08 DIAGNOSIS — I719 Aortic aneurysm of unspecified site, without rupture: Secondary | ICD-10-CM | POA: Diagnosis present

## 2023-05-08 DIAGNOSIS — I471 Supraventricular tachycardia, unspecified: Secondary | ICD-10-CM | POA: Diagnosis present

## 2023-05-08 DIAGNOSIS — I371 Nonrheumatic pulmonary valve insufficiency: Secondary | ICD-10-CM

## 2023-05-08 DIAGNOSIS — I351 Nonrheumatic aortic (valve) insufficiency: Secondary | ICD-10-CM | POA: Insufficient documentation

## 2023-05-08 DIAGNOSIS — I493 Ventricular premature depolarization: Secondary | ICD-10-CM | POA: Diagnosis not present

## 2023-05-08 DIAGNOSIS — I2510 Atherosclerotic heart disease of native coronary artery without angina pectoris: Secondary | ICD-10-CM | POA: Insufficient documentation

## 2023-05-08 DIAGNOSIS — E785 Hyperlipidemia, unspecified: Secondary | ICD-10-CM | POA: Diagnosis not present

## 2023-05-08 DIAGNOSIS — I4729 Other ventricular tachycardia: Secondary | ICD-10-CM | POA: Diagnosis present

## 2023-05-08 DIAGNOSIS — I340 Nonrheumatic mitral (valve) insufficiency: Secondary | ICD-10-CM | POA: Insufficient documentation

## 2023-05-08 DIAGNOSIS — I5022 Chronic systolic (congestive) heart failure: Secondary | ICD-10-CM

## 2023-05-08 MED ORDER — ROSUVASTATIN CALCIUM 20 MG PO TABS: 20.0000 mg | ORAL_TABLET | Freq: Every day | ORAL | 3 refills | Status: DC

## 2023-05-08 NOTE — Telephone Encounter (Signed)
Left the pt a message to call the office back to discuss CT recommendations per [facility] PA-C.

## 2023-05-08 NOTE — Patient Instructions (Signed)
Medication Instructions:  Your physician has recommended you make the following change in your medication:   STOP Simvastatin  START Rosuvastatin 20 mg taking 1 daily   *If you need a refill on your cardiac medications before your next appointment, please call your pharmacy*   Lab Work: TODAY:  MAG & TSH  3 MONTHS:  FASTING LIPID & CMET  If you have labs (blood work) drawn today and your tests are completely normal, you will receive your results only by: MyChart Message (if you have MyChart) OR A paper copy in the mail If you have any lab test that is abnormal or we need to change your treatment, we will call you to review the results.   Testing/Procedures: Your physician has requested that you have an echocardiogram. Echocardiography is a painless test that uses sound waves to create images of your heart. It provides your doctor with information about the size and shape of your heart and how well your heart's chambers and valves are working. This procedure takes approximately one hour. There are no restrictions for this procedure. Please do NOT wear cologne, perfume, aftershave, or lotions (deodorant is allowed). Please arrive 15 minutes prior to your appointment time.    Follow-Up: At [facility], you and your health needs are our priority.  As part of our continuing mission to provide you with exceptional heart care, we have created designated Provider Care Teams.  These Care Teams include your primary Cardiologist (physician) and Advanced Practice Providers (APPs -  Physician Assistants and Nurse Practitioners) who all work together to provide you with the care you need, when you need it.  We recommend signing up for the patient portal called "MyChart".  Sign up information is provided on this After Visit Summary.  MyChart is used to connect with patients for Virtual Visits (Telemedicine).  Patients are able to view lab/test results, encounter notes, upcoming appointments,  etc.  Non-urgent messages can be sent to your provider as well.   To learn more about what you can do with MyChart, go to [email or web address].    Your next appointment:   12 month(s)  Provider:   Tessa Lerner, DO     Other Instructions Information About Your Aneurysm  You reported to [place] that your recent CT scans howed an aneurysm. We will work on getting this report. The word "aneurysm" refers to a bulge in an artery (blood vessel). Mainstays of therapy for aneurysms include very good blood pressure control, healthy lifestyle, and avoiding tobacco products and street drugs. Research has raised concern that antibiotics in the fluoroquinolone class could be associated with increased risk of having an aneurysm develop or tear. This includes medicines that end in "floxacin," like Cipro or Levaquin. Make sure to discuss this information with other healthcare providers if you require antibiotics.  Since aneurysms can run in families, you should discuss your diagnosis with first degree relatives as they may need to be screened for this. Regular mild-moderate physical exercise is important, but avoid heavy lifting/weight lifting over 30lbs, chopping wood, shoveling snow or digging heavy earth with a shovel. It is best to avoid activities that cause grunting or straining (medically referred to as a "Valsalva maneuver"). This happens when a person bears down against a closed throat to increase the strength of arm or abdominal muscles. There's often a tendency to do this when lifting heavy weights, doing sit-ups, push-ups or chin-ups, etc., but it may be harmful.  Notify your healthcare team if you notice your  home blood pressures tending to run higher than 130 on the top number.  Contact a health care provider if you develop any discomfort in your upper back, neck, abdomen, trouble swallowing, cough or hoarseness, or unexplained weight loss. Get help right away if you develop severe pain in your upper  back or abdomen that may move into your chest and arms, or any other concerning symptoms such as shortness of breath or fever.

## 2023-05-08 NOTE — Telephone Encounter (Signed)
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 We can make a decision about referral to CT surgery at that time based on the size but for now it's at a size that does not require surgery.  Please find out if patient wants [place] to follow this for him.  If he wants [place] to follow: - please arrange a follow-up CT angio aorta w/ w/o CM on/after 07/17/23  - at today's visit, we arranged f/u CMET/lipid profile in 3 months - can move this up earlier between 2-3 months to occur in the week before the CT so that we have updated creatinine - recommend he call his [facility] pulmonologist to let them know since I think they put in for a follow-up CT in 1 year  Thanks!

## 2023-05-09 LAB — MAGNESIUM: Magnesium: 2.5 mg/dL — ABNORMAL HIGH (ref 1.6–2.3)

## 2023-05-09 LAB — TSH: TSH: 2.49 u[IU]/mL (ref 0.450–4.500)

## 2023-05-09 NOTE — Telephone Encounter (Signed)
Lab result note also completed when patient calls back

## 2023-05-12 NOTE — Telephone Encounter (Signed)
Left message for patient to call back  

## 2023-05-13 DIAGNOSIS — I7123 Aneurysm of the descending thoracic aorta, without rupture: Secondary | ICD-10-CM

## 2023-05-13 NOTE — Telephone Encounter (Signed)
See above MyChart encounter. Results have been discussed with the patient by Florentina Addison, RN

## 2023-05-13 NOTE — Telephone Encounter (Signed)
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 We can make a decision about referral to CT surgery at that time based on the size but for now it's at a size that does not require surgery.   Please find out if patient wants [place] to follow this for him.  If he wants [place] to follow: - please arrange a follow-up CT angio aorta w/ w/o CM on/after 07/17/23  - at today's visit, we arranged f/u CMET/lipid profile in 3 months - can move this up earlier between 2-3 months to occur in the week before the CT so that we have updated creatinine - recommend he call his [facility] pulmonologist to let them know since I think they put in for a follow-up CT in 1 year   Thanks!      Laurann Montana, PA-C 05/09/2023  7:40 AM EDT     See also phone note re: CT Please let pt know TSH is normal. Magnesium level is slightly elevated but he is not on any medicines that would cause this unless he is taking OTC Tums, Maalox or vitamins that contain this in which he can back off. This is something I would just have him discuss with PCP at his next scheduled visit since it's not much different than prior level.    Went over the information above with the patient. He decided that he wanted to f/u with [facility]. Order placed for repeat CT at [facility]. Informed that someone will call to schedule this appointment. He verbalized understanding.  He informed me that he has called back several times and has been unable to get through. A recording comes on and asks for a call back number, he gives it, then noone ever returns his call. He got one of his daughters to send a MyChart  message for him and this is the only way that anything has gotten through to anyone. Apologized and informed him that I would notify my supervisor of this.  I also went over directions to get onto MyChart- gave him his username and new password. He said he will probably need more help from a family member.

## 2023-05-13 NOTE — Telephone Encounter (Signed)
I also sent mychart message to patient letting him know to call regarding lab results and these CT recommendations.

## 2023-05-30 ENCOUNTER — Ambulatory Visit (HOSPITAL_COMMUNITY): Payer: Medicare Other | Attending: Physician Assistant

## 2023-05-30 DIAGNOSIS — I493 Ventricular premature depolarization: Secondary | ICD-10-CM | POA: Insufficient documentation

## 2023-05-30 DIAGNOSIS — I2510 Atherosclerotic heart disease of native coronary artery without angina pectoris: Secondary | ICD-10-CM | POA: Insufficient documentation

## 2023-05-30 DIAGNOSIS — I371 Nonrheumatic pulmonary valve insufficiency: Secondary | ICD-10-CM | POA: Insufficient documentation

## 2023-05-30 DIAGNOSIS — I340 Nonrheumatic mitral (valve) insufficiency: Secondary | ICD-10-CM | POA: Insufficient documentation

## 2023-05-30 DIAGNOSIS — I471 Supraventricular tachycardia, unspecified: Secondary | ICD-10-CM | POA: Diagnosis present

## 2023-05-30 DIAGNOSIS — I351 Nonrheumatic aortic (valve) insufficiency: Secondary | ICD-10-CM | POA: Diagnosis present

## 2023-05-30 DIAGNOSIS — E785 Hyperlipidemia, unspecified: Secondary | ICD-10-CM | POA: Insufficient documentation

## 2023-05-30 DIAGNOSIS — I719 Aortic aneurysm of unspecified site, without rupture: Secondary | ICD-10-CM | POA: Insufficient documentation

## 2023-05-30 DIAGNOSIS — I4729 Other ventricular tachycardia: Secondary | ICD-10-CM | POA: Diagnosis present

## 2023-05-30 DIAGNOSIS — I5022 Chronic systolic (congestive) heart failure: Secondary | ICD-10-CM | POA: Insufficient documentation

## 2023-05-30 LAB — ECHOCARDIOGRAM COMPLETE
Area-P 1/2: 2.76 cm2
S' Lateral: 3 cm

## 2023-06-09 ENCOUNTER — Telehealth: Payer: Self-pay

## 2023-06-09 NOTE — Telephone Encounter (Signed)
Spoke with patient and he is aware of echo results. He verbalized understanding,. No questions at this time.

## 2023-06-09 NOTE — Telephone Encounter (Signed)
-----   Message from Dean Wilkins sent at 06/06/2023  1:44 PM EST ----- Patient did not review mychart message that I sent to him. Please call him with this.

## 2023-07-22 ENCOUNTER — Ambulatory Visit
Admission: RE | Admit: 2023-07-22 | Discharge: 2023-07-22 | Disposition: A | Discharge status: Home / Self Care | Payer: Medicare Other | Source: Ambulatory Visit | Attending: Physician Assistant | Admitting: Physician Assistant

## 2023-07-22 DIAGNOSIS — I7123 Aneurysm of the descending thoracic aorta, without rupture: Secondary | ICD-10-CM

## 2023-07-22 MED ORDER — IOPAMIDOL (ISOVUE-370) INJECTION 76%
75.0000 mL | Freq: Once | INTRAVENOUS | Status: AC | PRN
  Administered 2023-07-22: 75 mL via INTRAVENOUS

## 2023-08-11 ENCOUNTER — Encounter (HOSPITAL_BASED_OUTPATIENT_CLINIC_OR_DEPARTMENT_OTHER): Payer: Self-pay

## 2023-08-20 ENCOUNTER — Other Ambulatory Visit: Payer: Self-pay | Admitting: Physician Assistant

## 2023-08-21 LAB — COMPREHENSIVE METABOLIC PANEL
ALT: 20 [IU]/L (ref 0–44)
AST: 21 [IU]/L (ref 0–40)
Albumin: 4 g/dL (ref 3.8–4.8)
Alkaline Phosphatase: 63 [IU]/L (ref 44–121)
BUN/Creatinine Ratio: 13 (ref 10–24)
BUN: 14 mg/dL (ref 8–27)
Bilirubin Total: 0.9 mg/dL (ref 0.0–1.2)
CO2: 23 mmol/L (ref 20–29)
Calcium: 9.3 mg/dL (ref 8.6–10.2)
Chloride: 102 mmol/L (ref 96–106)
Creatinine, Ser: 1.11 mg/dL (ref 0.76–1.27)
Globulin, Total: 2.3 g/dL (ref 1.5–4.5)
Glucose: 86 mg/dL (ref 70–99)
Potassium: 4.4 mmol/L (ref 3.5–5.2)
Sodium: 139 mmol/L (ref 134–144)
Total Protein: 6.3 g/dL (ref 6.0–8.5)
eGFR: 68 mL/min/{1.73_m2} (ref >59)

## 2023-08-21 LAB — LIPID PANEL
Chol/HDL Ratio: 2.6 {ratio} (ref 0.0–5.0)
Cholesterol, Total: 110 mg/dL (ref 100–199)
HDL: 43 mg/dL (ref >39)
LDL Chol Calc (NIH): 51 mg/dL (ref 0–99)
Triglycerides: 82 mg/dL (ref 0–149)
VLDL Cholesterol Cal: 16 mg/dL (ref 5–40)

## 2024-04-19 ENCOUNTER — Other Ambulatory Visit: Payer: Self-pay | Admitting: Physician Assistant

## 2024-05-05 ENCOUNTER — Ambulatory Visit: Admitting: Cardiology

## 2024-05-07 ENCOUNTER — Encounter: Payer: Self-pay | Admitting: Cardiology

## 2024-05-07 ENCOUNTER — Ambulatory Visit: Attending: Cardiology | Admitting: Cardiology

## 2024-05-07 VITALS — BP 138/86 | HR 75 | Ht 73.0 in | Wt 227.6 lb

## 2024-05-07 DIAGNOSIS — E7800 Pure hypercholesterolemia, unspecified: Secondary | ICD-10-CM | POA: Insufficient documentation

## 2024-05-07 DIAGNOSIS — I2510 Atherosclerotic heart disease of native coronary artery without angina pectoris: Secondary | ICD-10-CM | POA: Insufficient documentation

## 2024-05-07 DIAGNOSIS — R911 Solitary pulmonary nodule: Secondary | ICD-10-CM | POA: Insufficient documentation

## 2024-05-07 DIAGNOSIS — I10 Essential (primary) hypertension: Secondary | ICD-10-CM | POA: Diagnosis present

## 2024-05-07 DIAGNOSIS — Z951 Presence of aortocoronary bypass graft: Secondary | ICD-10-CM | POA: Insufficient documentation

## 2024-05-07 DIAGNOSIS — G4733 Obstructive sleep apnea (adult) (pediatric): Secondary | ICD-10-CM | POA: Insufficient documentation

## 2024-05-07 MED ORDER — ROSUVASTATIN CALCIUM 20 MG PO TABS: 20.0000 mg | ORAL_TABLET | Freq: Every day | ORAL | 3 refills | Status: AC

## 2024-05-07 NOTE — Patient Instructions (Signed)
 Medication Instructions:   Your physician recommends that you continue on your current medications as directed. Please refer to the Current Medication list given to you today.   *If you need a refill on your cardiac medications before your next appointment, please call your pharmacy*  Lab Work: NONE ORDERED  TODAY    If you have labs (blood work) drawn today and your tests are completely normal, you will receive your results only by: MyChart Message (if you have MyChart) OR A paper copy in the mail If you have any lab test that is abnormal or we need to change your treatment, we will call you to review the results.  Testing/Procedures:  IN OCTOBER  2026  Your physician has requested that you have an echocardiogram. Echocardiography is a painless test that uses sound waves to create images of your heart. It provides your doctor with information about the size and shape of your heart and how well your heart's chambers and valves are working. This procedure takes approximately one hour. There are no restrictions for this procedure. Please do NOT wear cologne, perfume, aftershave, or lotions (deodorant is allowed). Please arrive 15 minutes prior to your appointment time.  Please note: We ask at that you not bring children with you during ultrasound (echo/ vascular) testing. Due to room size and safety concerns, children are not allowed in the ultrasound rooms during exams. Our front office staff cannot provide observation of children in our lobby area while testing is being conducted. An adult accompanying a patient to their appointment will only be allowed in the ultrasound room at the discretion of the ultrasound technician under special circumstances. We apologize for any inconvenience.   Follow-Up: At [facility], you and your health needs are our priority.  As part of our continuing mission to provide you with exceptional heart care, our providers are all part of one team.  This  team includes your primary Cardiologist (physician) and Advanced Practice Providers or APPs (Physician Assistants and Nurse Practitioners) who all work together to provide you with the care you need, when you need it.  Your next appointment:  IN NOVEMBER 2026  1 year(s)  Provider:   Madonna Large, DO    We recommend signing up for the patient portal called MyChart.  Sign up information is provided on this After Visit Summary.  MyChart is used to connect with patients for Virtual Visits (Telemedicine).  Patients are able to view lab/test results, encounter notes, upcoming appointments, etc.  Non-urgent messages can be sent to your provider as well.   To learn more about what you can do with MyChart, go to [email or web address].   Other Instructions

## 2024-05-07 NOTE — Progress Notes (Unsigned)
 Cardiology Office Note:  .   Date:  05/07/2024  ID:  Dean Wilkins, DOB 1946-01-20, MRN [id number] PCP:  Lennie Boom, MD  Former Cardiology Providers: [facility] Providers Cardiologist:  Madonna Large, DO , [facility] (established care 03/08/2020) Electrophysiologist:  None  Click to update primary MD,subspecialty MD or APP then REFRESH:1}    No chief complaint on file.   History of Present Illness: .   Dean Wilkins is a 78 y.o. Caucasian male whose past medical history and cardiovascular risk factors includes: Hypertension, established coronary disease prior CABG in 1999 (LIMA to LAD, SVG to OM, SVG to RCA), hyperlipidemia, OSA on CPAP, Hx of SVT, advanced age.   Patient has known history of coronary artery disease status post bypass in 1999 and was last seen in the office in September 2023.    In 2024 patient was seen by Lonell Bring PA-C at that time repeat echocardiogram was recommended, lipid-lowering agents were uptitrated given his cardiovascular risk factors.    In January 2025 patient underwent CTA aorta with and without contrast results reviewed at today's office visit.  Per report no evidence of aneurysm on current CTA, mild aortic atherosclerosis, and partially calcified micronodular changes.  Since last office visit patient denies any anginal chest pain or heart failure symptoms.  No hospitalizations or urgent care visits for cardiovascular reasons.  He has been compliant with his medical therapy.  No significant weight gain.  Physical endurance remains stable, *** . Home SBP ranges between ***.   Review of Systems: .   ROS  Studies Reviewed:   EKG: EKG Interpretation Date/Time:  Friday May 07 2024 11:03:58 EDT Ventricular Rate:  73 PR Interval:  194 QRS Duration:  88 QT Interval:  372 QTC Calculation: 409 R Axis:   -2  Text Interpretation: Normal sinus rhythm Possible Left atrial enlargement Possible Anterior infarct (cited on or before 07-May-2024)  When compared with ECG of 08-May-2023 10:23, No significant change was found Confirmed by Large Madonna [phone]) on 05/07/2024 11:30:34 AM  Echocardiogram: 04/30/2017: LVEF 40-45%.     02/23/2021: Normal LV systolic function with visual EF 55-60%. Left ventricle cavity is normal in size. Mild left ventricular hypertrophy. Normal global wall motion. Abnormal septal wall motion due to post-op CABG. Normal diastolic filling pattern, normal LAP. Mild (Grade I) aortic regurgitation. Mild (Grade I) mitral regurgitation. Mild tricuspid regurgitation. No evidence of pulmonary hypertension. Mild to moderate pulmonic regurgitation.  05/2023  1. Inferior basal hypokinesis Endocardium not well seen No definity given  . Left ventricular ejection fraction, by estimation, is 50 to 55%. The  left ventricle has low normal function. The left ventricle has no regional  wall motion abnormalities. Left  ventricular diastolic parameters are consistent with Grade I diastolic  dysfunction (impaired relaxation).   2. Right ventricular systolic function is normal. The right ventricular  size is normal.   3. Left atrial size was moderately dilated.   4. The mitral valve is abnormal. Trivial mitral valve regurgitation. No  evidence of mitral stenosis.   5. The aortic valve is tricuspid. There is moderate calcification of the  aortic valve. Aortic valve regurgitation is mild. Aortic valve sclerosis  is present, with no evidence of aortic valve stenosis.   6. The inferior vena cava is normal in size with greater than 50%  respiratory variability, suggesting right atrial pressure of 3 mmHg.   Coronary angiogram 05/27/2017: Mild global hypokinesis, EF 45%.  No significant mitral regurgitation.  Normal LVEDP.  Left  main normal.  LAD occluded in the proximal segment.  Distal LAD supplied by LIMA, LIMA to LAD widely patent.  Native LAD diffusely diseased and small caliber.  Circumflex coronary artery proximal segment 40%  stenosis, moderate-sized OM1 with high-grade complex acute angle 95% stenosis.  Large OM 2 with mild 30% proximal stenosis.  RCA previously placed stents show about a 30% to 40% in-stent restenosis especially in the proximal segment but does not appear angiographically significant.   Recommendation: Patient's nuclear stress test is probably abnormal due to a moderate-sized OM1 high-grade stenosis.  However the anatomy is not very conducive for angioplasty, would recommend medical therapy.  Overall low risk for sudden cardiac death. Complex coronary anatomy, left radial access utilized, AL to catheter utilized to engage left main.  160 mL contrast utilized.  There was no immediate complication.  He will be discharged home today with outpatient follow-up.  Abdominal Ultrasound  [03/21/2015]: No AAA observed. Mild atheresclerosis.  RADIOLOGY: CTA chest aorta protocol. 07/24/2023 Relatively unchanged size and configuration of the thoracic aorta, including ascending, aortic arch, descending aorta. No evidence of aneurysm on the current CT angiogram.   Surgical changes of prior median sternotomy and CABG with native coronary artery calcifications. Mild aortic atherosclerosis. Aortic Atherosclerosis (ICD10-I70.0).   Pattern of partially calcified micro nodular changes in a predominantly lymphatic distribution at the lung bases. Differential diagnosis includes prior environmental exposure (such as silica), sarcoid/granulomatous disease, or less likely metastatic pulmonary calcification (most commonly associated with renal disease). Correlation with any history of environmental exposure may be useful, as well as consideration of referral for pulmonology evaluation.    Risk Assessment/Calculations:   NA   Labs:       Latest Ref Rng & Units 02/22/2022   11:26 AM 09/14/2019   10:58 AM 10/26/2008    5:55 AM  CBC  WBC 4.0 - 10.5 K/uL 7.9  8.0  5.2   Hemoglobin 13.0 - 17.0 g/dL 83.9  83.7  84.8    Hematocrit 39.0 - 52.0 % 46.5  47.5  43.7   Platelets 150 - 400 K/uL 151  133  103        Latest Ref Rng & Units 08/20/2023    8:50 AM 03/06/2022    9:30 AM 02/22/2022   11:26 AM  BMP  Glucose 70 - 99 mg/dL 86  858  93   BUN 8 - 27 mg/dL 14  13  13    Creatinine 0.76 - 1.27 mg/dL 8.88  8.99  8.96   BUN/Creat Ratio 10 - 24 13     Sodium 134 - 144 mmol/L 139  134  129   Potassium 3.5 - 5.2 mmol/L 4.4  4.2  4.5   Chloride 96 - 106 mmol/L 102  102  97   CO2 20 - 29 mmol/L 23  23  24    Calcium  8.6 - 10.2 mg/dL 9.3  8.8  9.2       Latest Ref Rng & Units 08/20/2023    8:50 AM 03/06/2022    9:30 AM 02/22/2022   11:26 AM  CMP  Glucose 70 - 99 mg/dL 86  858  93   BUN 8 - 27 mg/dL 14  13  13    Creatinine 0.76 - 1.27 mg/dL 8.88  8.99  8.96   Sodium 134 - 144 mmol/L 139  134  129   Potassium 3.5 - 5.2 mmol/L 4.4  4.2  4.5   Chloride 96 - 106 mmol/L 102  102  97   CO2 20 - 29 mmol/L 23  23  24    Calcium  8.6 - 10.2 mg/dL 9.3  8.8  9.2   Total Protein 6.0 - 8.5 g/dL 6.3     Total Bilirubin 0.0 - 1.2 mg/dL 0.9     Alkaline Phos 44 - 121 IU/L 63     AST 0 - 40 IU/L 21     ALT 0 - 44 IU/L 20       Lab Results  Component Value Date   CHOL 110 08/20/2023   HDL 43 08/20/2023   LDLCALC 51 08/20/2023   TRIG 82 08/20/2023   CHOLHDL 2.6 08/20/2023   No results for input(s): LIPOA in the last 8760 hours. No components found for: NTPROBNP No results for input(s): PROBNP in the last 8760 hours. Recent Labs    05/08/23 1252  TSH 2.490    Physical Exam:    Today's Vitals   05/07/24 1057  BP: 138/86  Pulse: 75  SpO2: 96%  Weight: 227 lb 9.6 oz (103.2 kg)  Height: 6' 1 (1.854 m)   Body mass index is 30.03 kg/m. Wt Readings from Last 3 Encounters:  05/07/24 227 lb 9.6 oz (103.2 kg)  05/08/23 229 lb 6.4 oz (104.1 kg)  04/18/22 239 lb (108.4 kg)    Physical Exam   Impression & Recommendation(s):  Impression:   ICD-10-CM   1. Atherosclerosis of native coronary artery of  native heart without angina pectoris  I25.10 EKG 12-Lead    2. History of coronary artery bypass graft x 3  Z95.1     3. Benign hypertension  I10     4. OSA on CPAP  G47.33     5. Pure hypercholesterolemia  E78.00        Recommendation(s):  ***  Orders Placed:  Orders Placed This Encounter  Procedures   EKG 12-Lead     Final Medication List:   No orders of the defined types were placed in this encounter.   There are no discontinued medications.   Current Outpatient Medications:    aspirin  EC 81 MG tablet, Take 81 mg by mouth daily. Swallow whole., Disp: , Rfl:    FARXIGA 10 MG TABS tablet, Take 10 mg by mouth every morning., Disp: , Rfl:    isosorbide mononitrate (IMDUR) 30 MG 24 hr tablet, Take 30 mg by mouth every evening. , Disp: , Rfl:    lisinopril (ZESTRIL) 20 MG tablet, Take 20 mg by mouth daily., Disp: , Rfl:    metoprolol  succinate (TOPROL -XL) 25 MG 24 hr tablet, TAKE 1 TABLET (25 MG TOTAL) BY MOUTH IN THE MORNING., Disp: 90 tablet, Rfl: 3   nitroGLYCERIN  (NITROSTAT ) 0.4 MG SL tablet, Place 1 tablet (0.4 mg total) under the tongue every 5 (five) minutes as needed for chest pain. If you require more than two tablets five minutes apart go to the nearest ER via EMS., Disp: 30 tablet, Rfl: 0   rosuvastatin  (CRESTOR ) 20 MG tablet, TAKE 1 TABLET BY MOUTH EVERY DAY, Disp: 90 tablet, Rfl: 0  Consent:   NA  Disposition:   ***  His questions and concerns were addressed to his satisfaction. He voices understanding of the recommendations provided during this encounter.    Signed, Madonna Michele HAS, [facility]  A Division of [facility] [address] [place], [place] [address]  05/07/2024 11:39 AM

## 2024-05-10 ENCOUNTER — Encounter: Payer: Self-pay | Admitting: Cardiology
